# Patient Record
Sex: Female | Born: 1950 | Race: White | Hispanic: No | Marital: Married | Smoking: Never smoker
Health system: Southern US, Community
[De-identification: ages and names within clinical notes are randomized; demographics above are authoritative.]

## PROBLEM LIST (undated history)

## (undated) DIAGNOSIS — R011 Cardiac murmur, unspecified: Secondary | ICD-10-CM

## (undated) DIAGNOSIS — G8929 Other chronic pain: Secondary | ICD-10-CM

## (undated) DIAGNOSIS — K219 Gastro-esophageal reflux disease without esophagitis: Secondary | ICD-10-CM

## (undated) DIAGNOSIS — K635 Polyp of colon: Secondary | ICD-10-CM

## (undated) DIAGNOSIS — M1990 Unspecified osteoarthritis, unspecified site: Secondary | ICD-10-CM

## (undated) DIAGNOSIS — M549 Dorsalgia, unspecified: Secondary | ICD-10-CM

## (undated) HISTORY — DX: Polyp of colon: K63.5

## (undated) HISTORY — PX: DILATION AND CURETTAGE, DIAGNOSTIC / THERAPEUTIC: SUR384

## (undated) HISTORY — PX: COLONOSCOPY: SHX174

## (undated) HISTORY — PX: ABDOMINOPLASTY: SUR9

## (undated) HISTORY — PX: DILATION AND CURETTAGE OF UTERUS: SHX78

## (undated) HISTORY — PX: AUGMENTATION MAMMAPLASTY: SUR837

## (undated) HISTORY — PX: BREAST BIOPSY: SHX20

## (undated) HISTORY — PX: JOINT REPLACEMENT: SHX530

---

## 2005-02-06 ENCOUNTER — Ambulatory Visit: Payer: Self-pay | Admitting: Internal Medicine

## 2009-08-02 ENCOUNTER — Ambulatory Visit: Payer: Self-pay | Admitting: Internal Medicine

## 2009-09-21 ENCOUNTER — Ambulatory Visit: Payer: Self-pay | Admitting: Gastroenterology

## 2009-09-24 ENCOUNTER — Ambulatory Visit: Payer: Self-pay | Admitting: Gastroenterology

## 2011-07-21 ENCOUNTER — Ambulatory Visit: Payer: Self-pay | Admitting: Internal Medicine

## 2011-07-25 ENCOUNTER — Ambulatory Visit: Payer: Self-pay | Admitting: Internal Medicine

## 2011-08-18 ENCOUNTER — Ambulatory Visit: Payer: Self-pay | Admitting: Surgery

## 2011-08-18 IMAGING — US US  BREAST BX W/ LOC DEV 1ST LESION IMG BX SPEC US GUIDE*R*
1 series · 9 of 9 positions shown · non-contrast
Comparison: none

REASON FOR EXAM: right breast mass
COMMENTS:

PROCEDURE:     US  - US GUIDED BIOPSY BREAST RIGHT  - [DATE]  [DATE]
RESULT:
HISTORY: Right breast mass.

[Series 1: us breast bx w/ loc dev 1st lesion img bx spec us  · 9 of 9 slices shown]
[im 1/9]
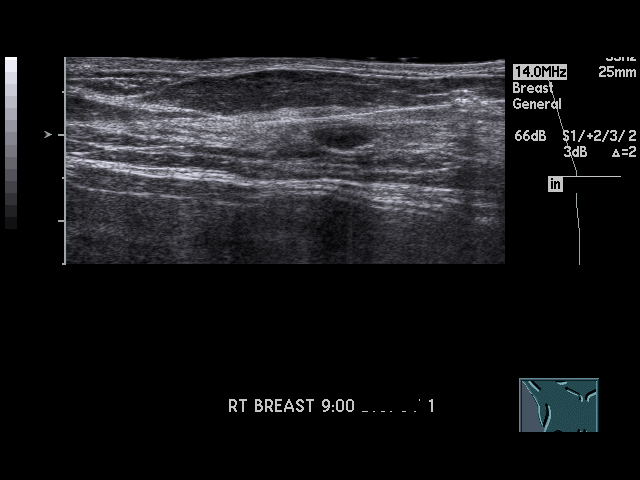
[im 2/9]
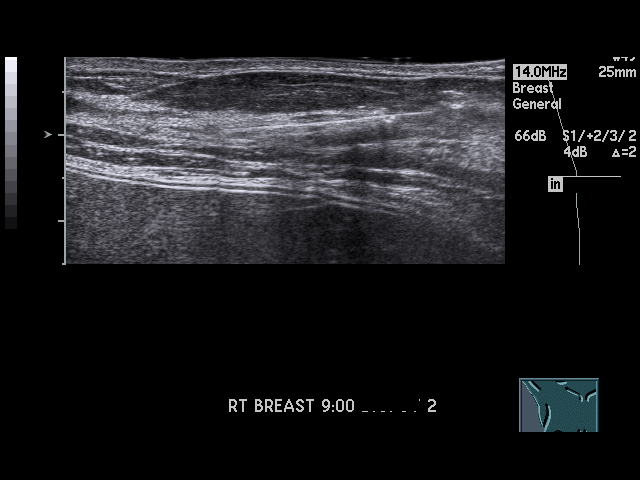
[im 3/9]
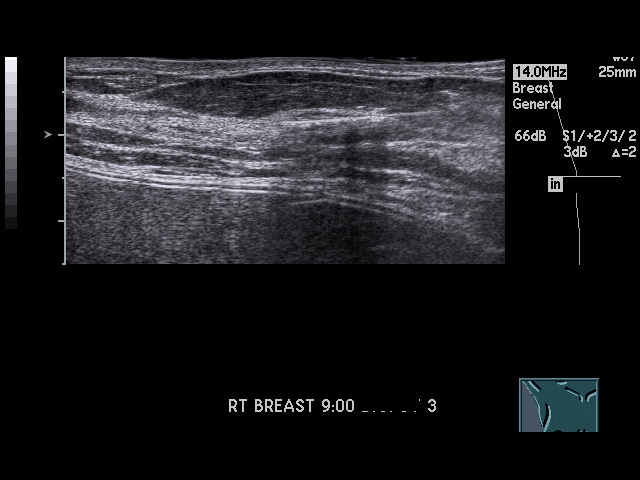
[im 4/9]
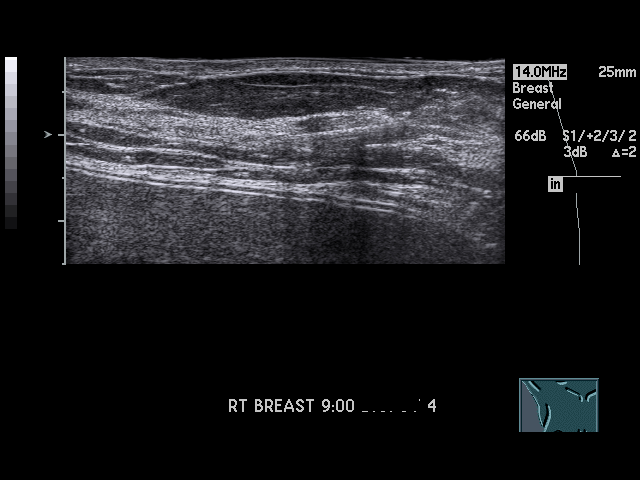
[im 5/9]
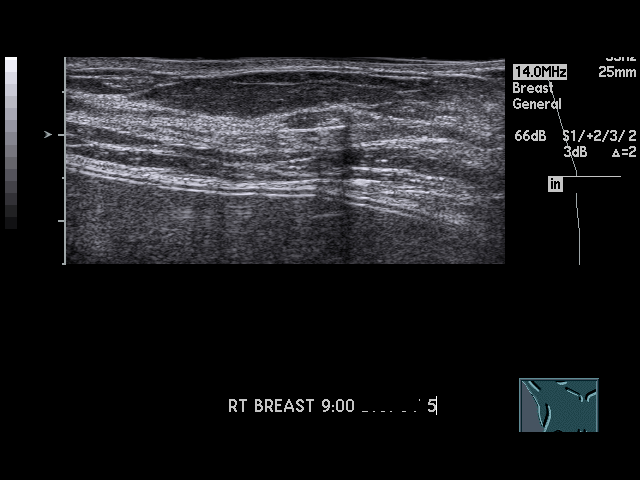
[im 6/9]
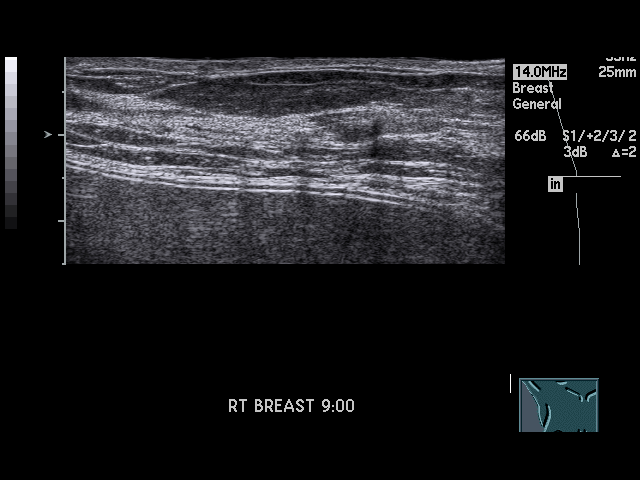
[im 7/9]
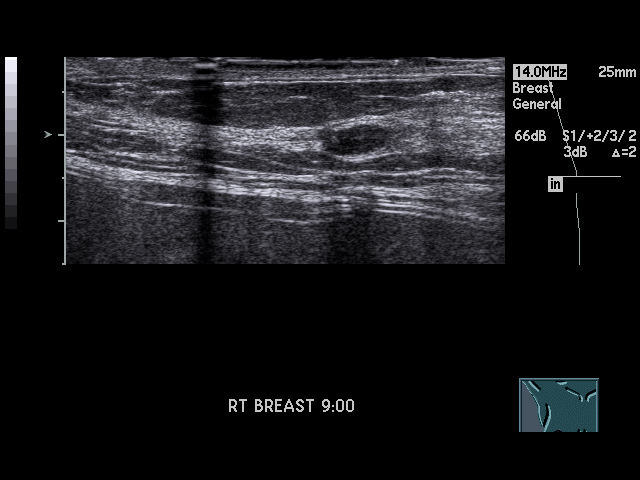
[im 8/9]
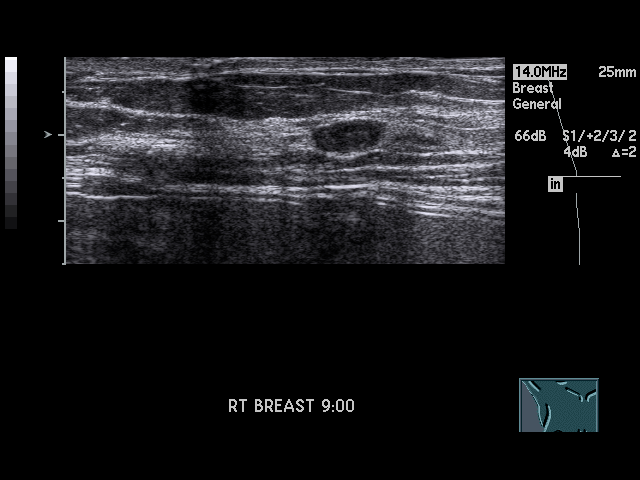
[im 9/9]
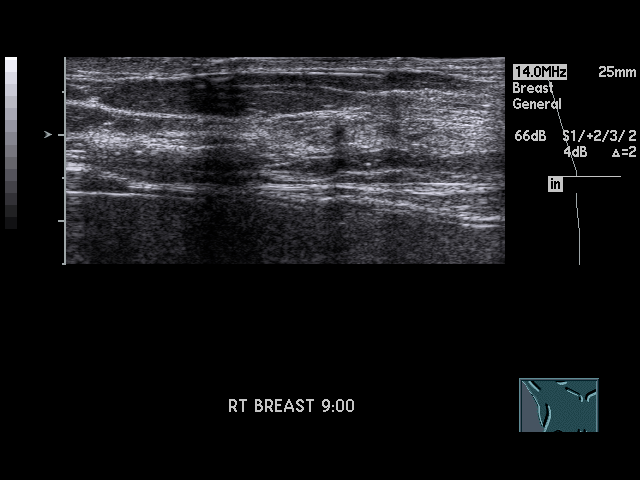

[9 of 9 positions shown; findings below may reference images not displayed]

PROCEDURE AND FINDINGS:  After discussing the risks and benefits of this
procedure, patient informed consent was obtained. The right breast was
sterilely prepped and draped. Following local anesthesia with 1% Lidocaine
and using ultrasound guidance, an 18 gauge Achieve Core Biopsy needle was
advanced into the lesion and multiple samples obtained. There were no
complications.
IMPRESSION: Successful right breast biopsy. If a definitive pathologic
diagnosis is not made, surgical removal of the nodular density is suggested.

## 2015-03-20 ENCOUNTER — Other Ambulatory Visit: Payer: Self-pay | Admitting: Nurse Practitioner

## 2015-03-20 DIAGNOSIS — K219 Gastro-esophageal reflux disease without esophagitis: Secondary | ICD-10-CM

## 2015-03-27 ENCOUNTER — Ambulatory Visit
Admission: RE | Admit: 2015-03-27 | Discharge: 2015-03-27 | Disposition: A | Discharge status: Home / Self Care | Payer: BC Managed Care – PPO | Source: Ambulatory Visit | Attending: Nurse Practitioner | Admitting: Nurse Practitioner

## 2015-03-27 DIAGNOSIS — K219 Gastro-esophageal reflux disease without esophagitis: Secondary | ICD-10-CM | POA: Insufficient documentation

## 2015-06-04 DIAGNOSIS — K219 Gastro-esophageal reflux disease without esophagitis: Secondary | ICD-10-CM | POA: Insufficient documentation

## 2015-06-04 DIAGNOSIS — K635 Polyp of colon: Secondary | ICD-10-CM | POA: Insufficient documentation

## 2015-07-20 ENCOUNTER — Encounter: Payer: Self-pay | Admitting: *Deleted

## 2015-07-23 ENCOUNTER — Ambulatory Visit: Payer: BC Managed Care – PPO | Admitting: Anesthesiology

## 2015-07-23 ENCOUNTER — Encounter
Admission: RE | Disposition: A | Discharge status: Home / Self Care | Payer: Self-pay | Source: Ambulatory Visit | Attending: Gastroenterology

## 2015-07-23 ENCOUNTER — Ambulatory Visit
Admission: RE | Admit: 2015-07-23 | Discharge: 2015-07-23 | Disposition: A | Discharge status: Home / Self Care | Payer: BC Managed Care – PPO | Source: Ambulatory Visit | Attending: Gastroenterology | Admitting: Gastroenterology

## 2015-07-23 DIAGNOSIS — D120 Benign neoplasm of cecum: Secondary | ICD-10-CM | POA: Insufficient documentation

## 2015-07-23 DIAGNOSIS — K640 First degree hemorrhoids: Secondary | ICD-10-CM | POA: Insufficient documentation

## 2015-07-23 DIAGNOSIS — K6389 Other specified diseases of intestine: Secondary | ICD-10-CM | POA: Diagnosis not present

## 2015-07-23 DIAGNOSIS — G8929 Other chronic pain: Secondary | ICD-10-CM | POA: Insufficient documentation

## 2015-07-23 DIAGNOSIS — Z1211 Encounter for screening for malignant neoplasm of colon: Secondary | ICD-10-CM | POA: Insufficient documentation

## 2015-07-23 DIAGNOSIS — Z79899 Other long term (current) drug therapy: Secondary | ICD-10-CM | POA: Insufficient documentation

## 2015-07-23 DIAGNOSIS — K219 Gastro-esophageal reflux disease without esophagitis: Secondary | ICD-10-CM | POA: Insufficient documentation

## 2015-07-23 DIAGNOSIS — M1990 Unspecified osteoarthritis, unspecified site: Secondary | ICD-10-CM | POA: Insufficient documentation

## 2015-07-23 DIAGNOSIS — K5730 Diverticulosis of large intestine without perforation or abscess without bleeding: Secondary | ICD-10-CM | POA: Insufficient documentation

## 2015-07-23 DIAGNOSIS — K621 Rectal polyp: Secondary | ICD-10-CM | POA: Diagnosis not present

## 2015-07-23 HISTORY — DX: Cardiac murmur, unspecified: R01.1

## 2015-07-23 HISTORY — DX: Unspecified osteoarthritis, unspecified site: M19.90

## 2015-07-23 HISTORY — DX: Other chronic pain: G89.29

## 2015-07-23 HISTORY — DX: Gastro-esophageal reflux disease without esophagitis: K21.9

## 2015-07-23 HISTORY — DX: Dorsalgia, unspecified: M54.9

## 2015-07-23 HISTORY — PX: COLONOSCOPY WITH PROPOFOL: SHX5780

## 2015-07-23 SURGERY — COLONOSCOPY WITH PROPOFOL
Anesthesia: General

## 2015-07-23 MED ORDER — PHENYLEPHRINE HCL 10 MG/ML IJ SOLN
INTRAMUSCULAR | Status: DC | PRN
  Administered 2015-07-23 (×2): 100 ug via INTRAVENOUS

## 2015-07-23 MED ORDER — PROPOFOL 10 MG/ML IV BOLUS
INTRAVENOUS | Status: DC | PRN
  Administered 2015-07-23: 30 mg via INTRAVENOUS

## 2015-07-23 MED ORDER — SODIUM CHLORIDE 0.9 % IV SOLN: INTRAVENOUS | Status: DC

## 2015-07-23 MED ORDER — SODIUM CHLORIDE 0.9 % IV SOLN
INTRAVENOUS | Status: DC
  Administered 2015-07-23: 1000 mL via INTRAVENOUS

## 2015-07-23 MED ORDER — FENTANYL CITRATE (PF) 100 MCG/2ML IJ SOLN
INTRAMUSCULAR | Status: DC | PRN
  Administered 2015-07-23: 2 ug via INTRAVENOUS

## 2015-07-23 MED ORDER — MIDAZOLAM HCL 2 MG/2ML IJ SOLN
INTRAMUSCULAR | Status: DC | PRN
  Administered 2015-07-23: 2 mg via INTRAVENOUS

## 2015-07-23 MED ORDER — PROPOFOL 500 MG/50ML IV EMUL
INTRAVENOUS | Status: DC | PRN
  Administered 2015-07-23: 120 ug/kg/min via INTRAVENOUS

## 2015-07-23 NOTE — Anesthesia Preprocedure Evaluation (Addendum)
Anesthesia Evaluation  Patient identified by MRN, date of birth, ID band Patient awake    Reviewed: Allergy & Precautions, NPO status , Patient's Chart, lab work & pertinent test results  Airway Mallampati: I       Dental  (+) Teeth Intact   Pulmonary neg pulmonary ROS,    Pulmonary exam normal        Cardiovascular negative cardio ROS   Rhythm:Regular     Neuro/Psych negative neurological ROS     GI/Hepatic Neg liver ROS, GERD  ,  Endo/Other  negative endocrine ROS  Renal/GU negative Renal ROS     Musculoskeletal   Abdominal Normal abdominal exam  (+)   Peds  Hematology negative hematology ROS (+)   Anesthesia Other Findings   Reproductive/Obstetrics                            Anesthesia Physical Anesthesia Plan  ASA: I  Anesthesia Plan: General   Post-op Pain Management:    Induction: Intravenous  Airway Management Planned: Natural Airway and Nasal Cannula  Additional Equipment:   Intra-op Plan:   Post-operative Plan:   Informed Consent: I have reviewed the patients History and Physical, chart, labs and discussed the procedure including the risks, benefits and alternatives for the proposed anesthesia with the patient or authorized representative who has indicated his/her understanding and acceptance.     Plan Discussed with: CRNA  Anesthesia Plan Comments:         Anesthesia Quick Evaluation

## 2015-07-23 NOTE — H&P (Signed)
Outpatient short stay form Pre-procedure 07/23/2015 8:06 AM Kathleen Sails MD  Primary Physician: Dr. Clayborn Bigness  Reason for visit:  Colonoscopy  History of present illness:  Personal history of adenomatous colon polyps. Patient is a 65 year old female presenting today for colonoscopy. She tolerated her prep well. She takes no aspirin or blood thinning products recently.    Current facility-administered medications:  .  0.9 %  sodium chloride infusion, , Intravenous, Continuous, Kathleen Sails, MD .  0.9 %  sodium chloride infusion, , Intravenous, Continuous, Kathleen Sails, MD  Prescriptions prior to admission  Medication Sig Dispense Refill Last Dose  . ascorbic Acid (VITAMIN C) 500 MG CPCR Take 500 mg by mouth daily.     . calcium-vitamin D (OSCAL WITH D) 500-200 MG-UNIT tablet Take 1 tablet by mouth 2 (two) times daily.     . cholecalciferol (D-VI-SOL) 400 UNIT/ML LIQD Take 1,000 Units by mouth daily.     . magnesium oxide (MAG-OX) 400 MG tablet Take 400 mg by mouth daily.     . Multiple Vitamin (MULTIVITAMIN) tablet Take 1 tablet by mouth daily.     Marland Kitchen omeprazole (PRILOSEC) 40 MG capsule Take 40 mg by mouth daily.     Marland Kitchen zinc gluconate 50 MG tablet Take 50 mg by mouth daily.        No Known Allergies   Past Medical History  Diagnosis Date  . Chronic back pain   . GERD (gastroesophageal reflux disease)   . Osteoarthritis   . Heart murmur     Review of systems:      Physical Exam    Heart and lungs: Regular rate and rhythm without rub or gallop, lungs are bilaterally clear.    HEENT: Normocephalic atraumatic eyes are anicteric    Other:     Pertinant exam for procedure: Soft nontender nondistended bowel sounds positive and normoactive.    Planned proceedures: Colonoscopy and indicated procedures. I have discussed the risks benefits and complications of procedures to include not limited to bleeding, infection, perforation and the risk of sedation and the  patient wishes to proceed.    Kathleen Sails, MD Gastroenterology 07/23/2015  8:06 AM

## 2015-07-23 NOTE — Anesthesia Postprocedure Evaluation (Signed)
Anesthesia Post Note  Patient: Kathleen Hart  Procedure(s) Performed: Procedure(s) (LRB): COLONOSCOPY WITH PROPOFOL (N/A)  Patient location during evaluation: PACU Anesthesia Type: General Level of consciousness: awake Pain management: pain level controlled Vital Signs Assessment: post-procedure vital signs reviewed and stable Respiratory status: spontaneous breathing Cardiovascular status: stable Anesthetic complications: no    Last Vitals:  Filed Vitals:   07/23/15 0753 07/23/15 0848  BP: 105/77 89/53  Pulse: 83 64  Temp: 36.4 C 36.1 C  Resp: 17 14    Last Pain: There were no vitals filed for this visit.               VAN STAVEREN,[name]

## 2015-07-23 NOTE — Op Note (Signed)
[facility] Gastroenterology Patient Name: Kathleen Hart Procedure Date: 07/23/2015 8:11 AM MRN: [id number] Account #: [id number] Date of Birth: February 26, 1951 Admit Type: Outpatient Age: 65 Room: [facility] ENDO ROOM 2 Gender: Female Note Status: Finalized Procedure:            Colonoscopy Indications:          Personal history of colonic polyps Providers:            Lollie Sails, MD Referring MD:         Lavera Guise, MD (Referring MD) Medicines:            Monitored Anesthesia Care Complications:        No immediate complications. Procedure:            Pre-Anesthesia Assessment:                       - ASA Grade Assessment: II - A patient with mild                        systemic disease.                       After obtaining informed consent, the colonoscope was                        passed under direct vision. Throughout the procedure,                        the patient's blood pressure, pulse, and oxygen                        saturations were monitored continuously. The                        Colonoscope was introduced through the anus and                        advanced to the the cecum, identified by appendiceal                        orifice and ileocecal valve. The colonoscopy was                        performed without difficulty. The patient tolerated the                        procedure well. The quality of the bowel preparation                        was good. Findings:      A 2 mm polyp was found in the cecum. The polyp was sessile. The polyp       was removed with a cold biopsy forceps. Resection and retrieval were       complete.      Five sessile polyps were found in the rectum. The polyps were 1 to 2 mm       in size. These polyps were removed with a cold biopsy forceps. Resection       and retrieval were complete.      Multiple small-mouthed diverticula were found in the sigmoid colon and  descending colon.      Non-bleeding internal  hemorrhoids were found during retroflexion. The       hemorrhoids were small and Grade I (internal hemorrhoids that do not       prolapse).      The digital rectal exam was normal. Impression:           - One 2 mm polyp in the cecum, removed with a cold                        biopsy forceps. Resected and retrieved.                       - Five 1 to 2 mm polyps in the rectum, removed with a                        cold biopsy forceps. Resected and retrieved.                       - Diverticulosis in the sigmoid colon and in the                        descending colon.                       - Non-bleeding internal hemorrhoids. Recommendation:       - Discharge patient to home. Procedure Code(s):    --- Professional ---                       [phone], Colonoscopy, flexible; with biopsy, single or                        multiple Diagnosis Code(s):    --- Professional ---                       D12.0, Benign neoplasm of cecum                       K62.1, Rectal polyp                       K64.0, First degree hemorrhoids                       Z86.010, Personal history of colonic polyps                       K57.30, Diverticulosis of large intestine without                        perforation or abscess without bleeding CPT copyright 2016 [employer]. All rights reserved. The codes documented in this report are preliminary and upon coder review may  be revised to meet current compliance requirements. Lollie Sails, MD 07/23/2015 8:46:29 AM This report has been signed electronically. Number of Addenda: 0 Note Initiated On: 07/23/2015 8:11 AM Scope Withdrawal Time: 0 hours 14 minutes 44 seconds  Total Procedure Duration: 0 hours 25 minutes 42 seconds       [facility]

## 2015-07-23 NOTE — Transfer of Care (Signed)
Immediate Anesthesia Transfer of Care Note  Patient: Kathleen Hart  Procedure(s) Performed: Procedure(s): COLONOSCOPY WITH PROPOFOL (N/A)  Patient Location: PACU  Anesthesia Type:General  Level of Consciousness: awake, alert  and oriented  Airway & Oxygen Therapy: Patient connected to nasal cannula oxygen  Post-op Assessment: Report given to RN  Post vital signs: Reviewed and stable  Last Vitals:  Filed Vitals:   07/23/15 0753  BP: 105/77  Pulse: 83  Temp: 36.4 C  Resp: 17    Complications: No apparent anesthesia complications

## 2015-07-24 LAB — SURGICAL PATHOLOGY

## 2015-07-25 ENCOUNTER — Encounter: Payer: Self-pay | Admitting: Gastroenterology

## 2015-10-18 DIAGNOSIS — G8929 Other chronic pain: Secondary | ICD-10-CM | POA: Insufficient documentation

## 2015-10-18 DIAGNOSIS — M1990 Unspecified osteoarthritis, unspecified site: Secondary | ICD-10-CM | POA: Insufficient documentation

## 2017-01-28 ENCOUNTER — Ambulatory Visit (INDEPENDENT_AMBULATORY_CARE_PROVIDER_SITE_OTHER): Payer: BC Managed Care – PPO | Admitting: Family

## 2017-01-28 ENCOUNTER — Ambulatory Visit: Payer: BC Managed Care – PPO | Admitting: Family

## 2017-01-28 ENCOUNTER — Encounter: Payer: Self-pay | Admitting: Family

## 2017-01-28 VITALS — BP 102/78 | HR 78 | Temp 97.9°F | Ht 69.0 in | Wt 228.0 lb

## 2017-01-28 DIAGNOSIS — Z7689 Persons encountering health services in other specified circumstances: Secondary | ICD-10-CM | POA: Insufficient documentation

## 2017-01-28 DIAGNOSIS — R350 Frequency of micturition: Secondary | ICD-10-CM | POA: Diagnosis not present

## 2017-01-28 DIAGNOSIS — E663 Overweight: Secondary | ICD-10-CM | POA: Insufficient documentation

## 2017-01-28 LAB — URINALYSIS, ROUTINE W REFLEX MICROSCOPIC
Bilirubin Urine: NEGATIVE
HGB URINE DIPSTICK: NEGATIVE
Ketones, ur: NEGATIVE
Leukocytes, UA: NEGATIVE
Nitrite: NEGATIVE
PH: 5.5 (ref 5.0–8.0)
RBC / HPF: NONE SEEN (ref 0–?)
TOTAL PROTEIN, URINE-UPE24: NEGATIVE
Urine Glucose: NEGATIVE
Urobilinogen, UA: 0.2 (ref 0.0–1.0)

## 2017-01-28 LAB — LIPID PANEL
CHOL/HDL RATIO: 3
Cholesterol: 169 mg/dL (ref 0–200)
HDL: 56.1 mg/dL (ref >39.00)
LDL CALC: 88 mg/dL (ref 0–99)
NONHDL: 112.4
Triglycerides: 122 mg/dL (ref 0.0–149.0)
VLDL: 24.4 mg/dL (ref 0.0–40.0)

## 2017-01-28 LAB — COMPREHENSIVE METABOLIC PANEL
ALK PHOS: 86 U/L (ref 39–117)
ALT: 14 U/L (ref 0–35)
AST: 14 U/L (ref 0–37)
Albumin: 4.4 g/dL (ref 3.5–5.2)
BUN: 15 mg/dL (ref 6–23)
CO2: 27 mEq/L (ref 19–32)
Calcium: 9.6 mg/dL (ref 8.4–10.5)
Chloride: 105 mEq/L (ref 96–112)
Creatinine, Ser: 0.76 mg/dL (ref 0.40–1.20)
GFR: 81.25 mL/min (ref >60.00)
Glucose, Bld: 111 mg/dL — ABNORMAL HIGH (ref 70–99)
POTASSIUM: 3.8 meq/L (ref 3.5–5.1)
SODIUM: 139 meq/L (ref 135–145)
TOTAL PROTEIN: 7.1 g/dL (ref 6.0–8.3)
Total Bilirubin: 1 mg/dL (ref 0.2–1.2)

## 2017-01-28 LAB — CBC WITH DIFFERENTIAL/PLATELET
BASOS PCT: 1 % (ref 0.0–3.0)
Basophils Absolute: 0.1 10*3/uL (ref 0.0–0.1)
EOS PCT: 5.2 % — AB (ref 0.0–5.0)
Eosinophils Absolute: 0.4 10*3/uL (ref 0.0–0.7)
HCT: 43.7 % (ref 36.0–46.0)
Hemoglobin: 14.8 g/dL (ref 12.0–15.0)
LYMPHS ABS: 2.2 10*3/uL (ref 0.7–4.0)
Lymphocytes Relative: 28.2 % (ref 12.0–46.0)
MCHC: 33.8 g/dL (ref 30.0–36.0)
MCV: 88.8 fl (ref 78.0–100.0)
MONO ABS: 0.7 10*3/uL (ref 0.1–1.0)
Monocytes Relative: 8.6 % (ref 3.0–12.0)
NEUTROS PCT: 57 % (ref 43.0–77.0)
Neutro Abs: 4.4 10*3/uL (ref 1.4–7.7)
PLATELETS: 199 10*3/uL (ref 150.0–400.0)
RBC: 4.92 Mil/uL (ref 3.87–5.11)
RDW: 13.6 % (ref 11.5–15.5)
WBC: 7.7 10*3/uL (ref 4.0–10.5)

## 2017-01-28 LAB — VITAMIN D 25 HYDROXY (VIT D DEFICIENCY, FRACTURES): VITD: 25.36 ng/mL — AB (ref 30.00–100.00)

## 2017-01-28 LAB — TSH: TSH: 3.91 u[IU]/mL (ref 0.35–4.50)

## 2017-01-28 LAB — HEMOGLOBIN A1C: HEMOGLOBIN A1C: 5.5 % (ref 4.6–6.5)

## 2017-01-28 NOTE — Progress Notes (Signed)
Subjective:    Patient ID: Kathleen Hart, female    DOB: January 12, 1951, 66 y.o.   MRN: [id number]  CC: Kathleen Hart is a 66 y.o. female who presents today to establish care.    HPI: Changing PCP from Dr Humphrey Rolls.   UTI's - 2 UTIs in the past year. One in March and the second in May. Didn't finish antibiotic from march. [facility]old she [facility].  Endorses urgency, urinary frequency for last 3 years, unchanged.   Frustrated by weight. 'highest every been'. Has been swimming this summer. Eats lots of vegetables   GERD- takes prilosec PRN. Well controlled. Improves when looses weight    mammogram utd, normal per patient this year.  HISTORY:  Past Medical History:  Diagnosis Date  . Chronic back pain   . Colon polyps   . GERD (gastroesophageal reflux disease)   . Heart murmur   . Osteoarthritis    Past Surgical History:  Procedure Laterality Date  . ABDOMINOPLASTY    . AUGMENTATION MAMMAPLASTY    . BREAST BIOPSY    . COLONOSCOPY    . COLONOSCOPY WITH PROPOFOL N/A 07/23/2015   Procedure: COLONOSCOPY WITH PROPOFOL;  Surgeon: Lollie Sails, MD;  Location: [facility] ENDOSCOPY;  Service: Endoscopy;  Laterality: N/A;  . DILATION AND CURETTAGE OF UTERUS    . DILATION AND CURETTAGE, DIAGNOSTIC / THERAPEUTIC     Family History  Problem Relation Age of Onset  . Arthritis Mother   . Kidney disease Mother   . Cancer Mother        bladder  . Cancer Father        prostate    Allergies: Patient has no known allergies. Current Outpatient Prescriptions on File Prior to Visit  Medication Sig Dispense Refill  . ascorbic Acid (VITAMIN C) 500 MG CPCR Take 500 mg by mouth daily.    . calcium-vitamin D (OSCAL WITH D) 500-200 MG-UNIT tablet Take 1 tablet by mouth 2 (two) times daily.    . magnesium oxide (MAG-OX) 400 MG tablet Take 400 mg by mouth daily.    . Multiple Vitamin (MULTIVITAMIN) tablet Take 1 tablet by mouth daily.    Marland Kitchen omeprazole (PRILOSEC) 40 MG  capsule Take 40 mg by mouth daily.     No current facility-administered medications on file prior to visit.     Social History  Substance Use Topics  . Smoking status: Never Smoker  . Smokeless tobacco: Never Used  . Alcohol use Yes    Review of Systems  Constitutional: Negative for chills and fever.  Respiratory: Negative for cough.   Cardiovascular: Negative for chest pain and palpitations.  Gastrointestinal: Negative for nausea and vomiting.  Genitourinary: Positive for frequency and urgency. Negative for dysuria and hematuria.      Objective:    BP 102/78   Pulse 78   Temp 97.9 F (36.6 C) (Oral)   Ht 5\' 9"  (1.753 m)   Wt 228 lb (103.4 kg)   SpO2 98%   BMI 33.67 kg/m  BP Readings from Last 3 Encounters:  01/28/17 102/78  07/23/15 97/68   Wt Readings from Last 3 Encounters:  01/28/17 228 lb (103.4 kg)  07/23/15 200 lb (90.7 kg)    Physical Exam  Constitutional: She appears well-developed and well-nourished.  Eyes: Conjunctivae are normal.  Neck: No thyroid mass and no thyromegaly present.  Cardiovascular: Normal rate, regular rhythm, normal heart sounds and normal pulses.   Pulmonary/Chest: Effort normal  and breath sounds normal. She has no wheezes. She has no rhonchi. She has no rales.  Abdominal: There is no CVA tenderness.  Lymphadenopathy:       Head (right side): No submental, no submandibular, no tonsillar, no preauricular, no posterior auricular and no occipital adenopathy present.       Head (left side): No submental, no submandibular, no tonsillar, no preauricular, no posterior auricular and no occipital adenopathy present.    She has no cervical adenopathy.  Neurological: She is alert.  Skin: Skin is warm and dry.  Psychiatric: She has a normal mood and affect. Her speech is normal and behavior is normal. Thought content normal.  Vitals reviewed.      Assessment & Plan:   Problem List Items Addressed This Visit      Other   Encounter to  establish care - Primary    CPE labs ordered; patient will return for CPE.       Relevant Orders   Comprehensive metabolic panel   CBC with Differential/Platelet   Hemoglobin A1c   Lipid panel   TSH   VITAMIN D 25 Hydroxy (Vit-D Deficiency, Fractures)   Urinary frequency    Symptoms most consistent with OAB.  Education on timed bladder voiding. Pending urine studies, will follow.       Relevant Orders   Urinalysis, Routine w reflex microscopic   Overweight    Education provided on low glycemic diet. Discussed the role of medications including wellbutrin, saxenda, and we will continue to discuss weight loss management at future visits.           I have discontinued Ms. Ayotte's cholecalciferol and zinc gluconate. I am also having her maintain her ascorbic Acid, calcium-vitamin D, magnesium oxide, multivitamin, and omeprazole.   No orders of the defined types were placed in this encounter.   Return precautions given.   Risks, benefits, and alternatives of the medications and treatment plan prescribed today were discussed, and patient expressed understanding.   Education regarding symptom management and diagnosis given to patient on AVS.  Continue to follow with Burnard Hawthorne, FNP for routine health maintenance.   Kathleen Hart and I agreed with plan.   Mable Paris, FNP

## 2017-01-28 NOTE — Assessment & Plan Note (Signed)
Education provided on low glycemic diet. Discussed the role of medications including wellbutrin, saxenda, and we will continue to discuss weight loss management at future visits.

## 2017-01-28 NOTE — Assessment & Plan Note (Signed)
CPE labs ordered; patient will return for CPE.

## 2017-01-28 NOTE — Progress Notes (Signed)
Pre visit review using our clinic review tool, if applicable. No additional management support is needed unless otherwise documented below in the visit note. 

## 2017-01-28 NOTE — Assessment & Plan Note (Addendum)
Symptoms most consistent with OAB.  Education on timed bladder voiding. Pending urine studies, will follow.

## 2017-01-28 NOTE — Patient Instructions (Addendum)
Pleasure meeting you!  Please return for complete physical  This is  Dr. Lupita Dawn  example of a  "Low GI"  Diet:  It will allow you to lose 4 to 8  lbs  per month if you follow it carefully.  Your goal with exercise is a minimum of 30 minutes of aerobic exercise 5 days per week (Walking does not count once it becomes easy!)    All of the foods can be found at grocery stores and in bulk at [employer].  The Atkins protein bars and shakes are available in more varieties at [employer], [employer] and [employer].     7 AM Breakfast:  Choose from the following:  Low carbohydrate Protein  Shakes (I recommend the  Premier Protein chocolate shakes,  EAS AdvantEdge "Carb Control" shakes  Or the Atkins shakes all are under 3 net carbs)     a scrambled egg/bacon/cheese burrito made with Mission's "carb balance" whole wheat tortilla  (about 10 net carbs )  Regulatory affairs officer (basically a quiche without the pastry crust) that is eaten cold and very convenient way to get your eggs.  8 carbs)  If you make your own protein shakes, avoid bananas and pineapple,  And use low carb greek yogurt or original /unsweetened almond or soy milk    Avoid cereal and bananas, oatmeal and cream of wheat and grits. They are loaded with carbohydrates!   10 AM: high protein snack:  Protein bar by Atkins (the snack size, under 200 cal, usually < 6 net carbs).    A stick of cheese:  Around 1 carb,  100 cal     Dannon Light n Fit [place] Yogurt  (80 cal, 8 carbs)  Other so called "protein bars" and Greek yogurts tend to be loaded with carbohydrates.  Remember, in food advertising, the word "energy" is synonymous for " carbohydrate."  Lunch:   A Sandwich using the bread choices listed, Can use any  Eggs,  lunchmeat, grilled meat or canned tuna), avocado, regular mayo/mustard  and cheese.  A Salad using blue cheese, ranch,  Goddess or vinagrette,  Avoid taco shells, croutons or "confetti" and no "candied nuts" but  regular nuts OK.   No pretzels, nabs  or chips.  Pickles and miniature sweet peppers are a good low carb alternative that provide a "crunch"  The bread is the only source of carbohydrate in a sandwich and  can be decreased by trying some of the attached alternatives to traditional loaf bread   Avoid "Low fat dressings, as well as Steele dressings They are loaded with sugar!   3 PM/ Mid day  Snack:  Consider  1 ounce of  almonds, walnuts, pistachios, pecans, peanuts,  Macadamia nuts or a nut medley.  Avoid "granola and granola bars "  Mixed nuts are ok in moderation as long as there are no raisins,  cranberries or dried fruit.   KIND bars are OK if you get the low glycemic index variety   Try the prosciutto/mozzarella cheese sticks by Fior[employer]  In deli /backery section   High protein      6 PM  Dinner:     Meat/fowl/fish with a green salad, and either broccoli, cauliflower, green beans, spinach, brussel sprouts or  Lima beans. DO NOT BREAD THE PROTEIN!!      There is a low carb pasta by Dreamfield's that is acceptable and tastes great: only 5 digestible carbs/serving.( All grocery stores  but BJs carry it ) Several ready made meals are available low carb:   Try Michel Angelo's chicken piccata or chicken or eggplant parm over low carb pasta.(Lowes and BJs)   Marjory Lies Sanchez's "Carnitas" (pulled pork, no sauce,  0 carbs) or his beef pot roast to make a dinner burrito (at [employer])  Pesto over low carb pasta (bj's sells a good quality pesto in the center refrigerated section of the deli   Try satueeing  Cheral Marker with mushroooms as a good side   [employer] makes a mashed cauliflower that tastes like mashed potatoes  Whole wheat pasta is still full of digestible carbs and  Not as low in glycemic index as Dreamfield's.   Brown rice is still rice,  So skip the rice and noodles if you eat [place] or [place] (or at least limit to 1/2 cup)  9 PM snack :   Breyer's "low carb" fudgsicle  or  ice cream bar (Carb Smart line), or  Weight Watcher's ice cream bar , or another "no sugar added" ice cream;  a serving of fresh berries/cherries with whipped cream   Cheese or DANNON'S LlGHT N FIT GREEK YOGURT  8 ounces of [place] unsweetened almond/cococunut milk    Treat yourself to a parfait made with whipped cream blueberiies, walnuts and vanilla greek yogurt  Avoid bananas, pineapple, grapes  and watermelon on a regular basis because they are high in sugar.  THINK OF THEM AS DESSERT  Remember that snack Substitutions should be less than 10 NET carbs per serving and meals < 20 carbs. Remember to subtract fiber grams to get the "net carbs."   Overactive Bladder, Adult Overactive bladder is a group of urinary symptoms. With overactive bladder, you may suddenly feel the need to pass urine (urinate) right away. After feeling this sudden urge, you might also leak urine if you cannot get to the bathroom fast enough (urinary incontinence). These symptoms might interfere with your daily work or social activities. Overactive bladder symptoms may also wake you up at night. Overactive bladder affects the nerve signals between your bladder and your brain. Your bladder may get the signal to empty before it is full. Very sensitive muscles can also make your bladder squeeze too soon. What are the causes? Many things can cause an overactive bladder. Possible causes include:  Urinary tract infection.  Infection of nearby tissues, such as the prostate.  Prostate enlargement.  Being pregnant with twins or more (multiples).  Surgery on the uterus or urethra.  Bladder stones, inflammation, or tumors.  Drinking too much caffeine or alcohol.  Certain medicines, especially those that you take to help your body get rid of extra fluid (diuretics) by increasing urine production.  Muscle or nerve weakness, especially from: ? A spinal cord injury. ? Stroke. ? Multiple sclerosis. ? Parkinson  disease.  Diabetes. This can cause a high urine volume that fills the bladder so quickly that the normal urge to urinate is triggered very strongly.  Constipation. A buildup of too much stool can put pressure on your bladder.  What increases the risk? You may be at greater risk for overactive bladder if you:  Are an older adult.  Smoke.  Are going through menopause.  Have prostate problems.  Have a neurological disease, such as stroke, dementia, Parkinson disease, or multiple sclerosis (MS).  Eat or drink things that irritate the bladder. These include alcohol, spicy food, and caffeine.  Are overweight or obese.  What are the signs  or symptoms? The signs and symptoms of an overactive bladder include:  Sudden, strong urges to urinate.  Leaking urine.  Urinating eight or more times per day.  Waking up to urinate two or more times per night.  How is this diagnosed? Your health care provider may suspect overactive bladder based on your symptoms. The health care provider will do a physical exam and take your medical history. Blood or urine tests may also be done. For example, you might need to have a bladder function test to check how well you can hold your urine. You might also need to see a health care provider who specializes in the urinary tract (urologist). How is this treated? Treatment for overactive bladder depends on the cause of your condition and whether it is mild or severe. Certain treatments can be done in your health care provider's office or clinic. You can also make lifestyle changes at home. Options include: Behavioral Treatments  Biofeedback. A specialist uses sensors to help you become aware of your body's signals.  Keeping a daily log of when you need to urinate and what happens after the urge. This may help you manage your condition.  Bladder training. This helps you learn to control the urge to urinate by following a schedule that directs you to urinate  at regular intervals (timed voiding). At first, you might have to wait a few minutes after feeling the urge. In time, you should be able to schedule bathroom visits an hour or more apart.  Kegel exercises. These are exercises to strengthen the pelvic floor muscles, which support the bladder. Toning these muscles can help you control urination, even if your bladder muscles are overactive. A specialist will teach you how to do these exercises correctly. They require daily practice.  Weight loss. If you are obese or overweight, losing weight might relieve your symptoms of overactive bladder. Talk to your health care provider about losing weight and whether there is a specific program or method that would work best for you.  Diet change. This might help if constipation is making your overactive bladder worse. Your health care provider or a dietitian can explain ways to change what you eat to ease constipation. You might also need to consume less alcohol and caffeine or drink other fluids at different times of the day.  Stopping smoking.  Wearing pads to absorb leakage while you wait for other treatments to take effect. Physical Treatments  Electrical stimulation. Electrodes send gentle pulses of electricity to strengthen the nerves or muscles that help to control the bladder. Sometimes, the electrodes are placed outside of the body. In other cases, they might be placed inside the body (implanted). This treatment can take several months to have an effect.  Supportive devices. Women may need a plastic device that fits into the vagina and supports the bladder (pessary). Medicines Several medicines can help treat overactive bladder and are usually used along with other treatments. Some are injected into the muscles involved in urination. Others come in pill form. Your health care provider may prescribe:  Antispasmodics. These medicines block the signals that the nerves send to the bladder. This keeps the  bladder from releasing urine at the wrong time.  Tricyclic antidepressants. These types of antidepressants also relax bladder muscles.  Surgery  You may have a device implanted to help manage the nerve signals that indicate when you need to urinate.  You may have surgery to implant electrodes for electrical stimulation.  Sometimes, very severe cases of  overactive bladder require surgery to change the shape of the bladder. Follow these instructions at home:  Take medicines only as directed by your health care provider.  Use any implants or a pessary as directed by your health care provider.  Make any diet or lifestyle changes that are recommended by your health care provider. These might include: ? Drinking less fluid or drinking at different times of the day. If you need to urinate often during the night, you may need to stop drinking fluids early in the evening. ? Cutting down on caffeine or alcohol. Both can make an overactive bladder worse. Caffeine is found in coffee, tea, and sodas. ? Doing Kegel exercises to strengthen muscles. ? Losing weight if you need to. ? Eating a healthy and balanced diet to prevent constipation.  Keep a journal or log to track how much and when you drink and also when you feel the need to urinate. This will help your health care provider to monitor your condition. Contact a health care provider if:  Your symptoms do not get better after treatment.  Your pain and discomfort are getting worse.  You have more frequent urges to urinate.  You have a fever. Get help right away if: You are not able to control your bladder at all. This information is not intended to replace advice given to you by your health care provider. Make sure you discuss any questions you have with your health care provider. Document Released: 02/15/2009 Document Revised: 09/27/2015 Document Reviewed: 09/14/2013 Elsevier Interactive Patient Education  [employer].

## 2017-01-29 ENCOUNTER — Telehealth: Payer: Self-pay | Admitting: *Deleted

## 2017-01-29 NOTE — Telephone Encounter (Signed)
Pt requested lab results  Pt contact [phone]

## 2017-01-29 NOTE — Telephone Encounter (Signed)
Patient was informed of results.  Patient understood and no questions, comments, or concerns at this time.  

## 2017-07-20 LAB — HM MAMMOGRAPHY

## 2018-02-24 ENCOUNTER — Other Ambulatory Visit: Payer: Self-pay

## 2018-02-24 ENCOUNTER — Encounter
Admission: RE | Admit: 2018-02-24 | Discharge: 2018-02-24 | Disposition: A | Discharge status: Home / Self Care | Payer: Medicare Other | Source: Ambulatory Visit | Attending: Surgery | Admitting: Surgery

## 2018-02-24 ENCOUNTER — Ambulatory Visit
Admission: RE | Admit: 2018-02-24 | Discharge: 2018-02-24 | Disposition: A | Discharge status: Home / Self Care | Payer: Medicare Other | Source: Ambulatory Visit | Attending: Surgery | Admitting: Surgery

## 2018-02-24 DIAGNOSIS — M1711 Unilateral primary osteoarthritis, right knee: Secondary | ICD-10-CM | POA: Insufficient documentation

## 2018-02-24 DIAGNOSIS — Z01818 Encounter for other preprocedural examination: Secondary | ICD-10-CM | POA: Insufficient documentation

## 2018-02-24 LAB — URINALYSIS, ROUTINE W REFLEX MICROSCOPIC
Glucose, UA: NEGATIVE mg/dL
KETONES UR: NEGATIVE mg/dL
Leukocytes, UA: NEGATIVE
Nitrite: NEGATIVE
Protein, ur: NEGATIVE mg/dL
Specific Gravity, Urine: >1.03 — ABNORMAL HIGH (ref 1.005–1.030)
pH: 5.5 (ref 5.0–8.0)

## 2018-02-24 LAB — TYPE AND SCREEN
ABO/RH(D): B POS
Antibody Screen: NEGATIVE

## 2018-02-24 LAB — CBC
HEMATOCRIT: 44.1 % (ref 36.0–46.0)
Hemoglobin: 14.1 g/dL (ref 12.0–15.0)
MCH: 28.7 pg (ref 26.0–34.0)
MCHC: 32 g/dL (ref 30.0–36.0)
MCV: 89.6 fL (ref 80.0–100.0)
NRBC: 0 % (ref 0.0–0.2)
PLATELETS: 210 10*3/uL (ref 150–400)
RBC: 4.92 MIL/uL (ref 3.87–5.11)
RDW: 12.7 % (ref 11.5–15.5)
WBC: 9 10*3/uL (ref 4.0–10.5)

## 2018-02-24 LAB — BASIC METABOLIC PANEL
Anion gap: 7 (ref 5–15)
BUN: 31 mg/dL — AB (ref 8–23)
CO2: 28 mmol/L (ref 22–32)
CREATININE: 0.85 mg/dL (ref 0.44–1.00)
Calcium: 9.1 mg/dL (ref 8.9–10.3)
Chloride: 107 mmol/L (ref 98–111)
Glucose, Bld: 105 mg/dL — ABNORMAL HIGH (ref 70–99)
Potassium: 3.7 mmol/L (ref 3.5–5.1)
SODIUM: 142 mmol/L (ref 135–145)

## 2018-02-24 LAB — URINALYSIS, MICROSCOPIC (REFLEX): Bacteria, UA: NONE SEEN

## 2018-02-24 LAB — SURGICAL PCR SCREEN
MRSA, PCR: NEGATIVE
Staphylococcus aureus: NEGATIVE

## 2018-02-24 NOTE — Patient Instructions (Signed)
YOUR PROCEDURE IS ON Tuesday March 09, 2018  Report to Day Surgery on the 2nd floor of the [facility]. To find out your arrival time, please call [phone] between 1PM - 3PM on: Monday March 08, 2018  REMEMBER: Instructions that are not followed completely may result in serious medical risk, up to and including death; or upon the discretion of your surgeon and anesthesiologist your surgery may need to be rescheduled.  Do not eat food after midnight the night before surgery.  No gum chewing, lozengers or hard candies.  You may however, drink CLEAR liquids up to 2 hours before you are scheduled to arrive for your surgery. Do not drink anything within 2 hours of the start of your surgery.  Clear liquids include: - water  - apple juice without pulp - CLEAR gatorade - black coffee or tea (Do NOT add milk or creamers to the coffee or tea) Do NOT drink anything that is not on this list.  Type 1 and Type 2 diabetics should only drink water.    No Alcohol for 24 hours before or after surgery.  No Smoking including e-cigarettes for 24 hours prior to surgery.  No chewable tobacco products for at least 6 hours prior to surgery.  No nicotine patches on the day of surgery.  On the morning of surgery brush your teeth with toothpaste and water, you may rinse your mouth with mouthwash if you wish. Do not swallow any toothpaste or mouthwash.  Notify your doctor if there is any change in your medical condition (cold, fever, infection).  Do not wear jewelry, make-up, hairpins, clips or nail polish.  Do not wear lotions, powders, or perfumes.   Do not shave 48 hours prior to surgery.   Contacts and dentures may not be worn into surgery.  Do not bring valuables to the hospital, including drivers license, insurance or credit cards.   is not responsible for any belongings or valuables.   TAKE THESE MEDICATIONS THE MORNING OF SURGERY: OMEPRAZOLE (take one the night before  and one on the morning of surgery - helps to prevent nausea after surgery.)  Use CHG Soap as directed on instruction sheet.  Stop Anti-inflammatories (NSAIDS) such as Advil, Aleve, Ibuprofen, Motrin, Naproxen, Naprosyn and Aspirin based products such as Excedrin, Goodys Powder, BC Powder FOR 7-10 DAYS BEFORE SURGERY  (May take Tylenol or Acetaminophen if needed.)  Stop ANY OVER THE COUNTER supplements until after surgery. (May continue Vitamin D, Vitamin B, and multivitamin.)  Wear comfortable clothing (specific to your surgery type) to the hospital.  Plan for stool softeners for home use.  If you are being admitted to the hospital overnight, leave your suitcase in the car. After surgery it may be brought to your room.  If you are being discharged the day of surgery, you will not be allowed to drive home. You will need a responsible adult to drive you home and stay with you that night.   If you are taking public transportation, you will need to have a responsible adult with you. Please confirm with your physician that it is acceptable to use public transportation.   Please call [phone] if you have any questions about these instructions.

## 2018-02-25 NOTE — Pre-Procedure Instructions (Signed)
UA FAXED TO DR POGGI 

## 2018-02-26 LAB — URINE CULTURE

## 2018-03-01 NOTE — Pre-Procedure Instructions (Signed)
UC FAXED TO DR POGGI 

## 2018-03-02 ENCOUNTER — Encounter
Admission: RE | Admit: 2018-03-02 | Discharge: 2018-03-02 | Disposition: A | Discharge status: Home / Self Care | Payer: Medicare Other | Source: Ambulatory Visit | Attending: Surgery | Admitting: Surgery

## 2018-03-02 DIAGNOSIS — Z01812 Encounter for preprocedural laboratory examination: Secondary | ICD-10-CM | POA: Diagnosis present

## 2018-03-02 NOTE — Pre-Procedure Instructions (Signed)
COMING TODAY FOR REPEAT UC

## 2018-03-03 ENCOUNTER — Ambulatory Visit: Payer: Medicare Other | Admitting: Family Medicine

## 2018-03-03 ENCOUNTER — Encounter: Payer: Self-pay | Admitting: Family Medicine

## 2018-03-03 VITALS — BP 118/80 | HR 67 | Temp 97.9°F | Wt 226.4 lb

## 2018-03-03 DIAGNOSIS — M150 Primary generalized (osteo)arthritis: Secondary | ICD-10-CM | POA: Diagnosis not present

## 2018-03-03 DIAGNOSIS — K219 Gastro-esophageal reflux disease without esophagitis: Secondary | ICD-10-CM | POA: Diagnosis not present

## 2018-03-03 DIAGNOSIS — Z23 Encounter for immunization: Secondary | ICD-10-CM

## 2018-03-03 DIAGNOSIS — R5382 Chronic fatigue, unspecified: Secondary | ICD-10-CM | POA: Diagnosis not present

## 2018-03-03 DIAGNOSIS — E669 Obesity, unspecified: Secondary | ICD-10-CM | POA: Insufficient documentation

## 2018-03-03 DIAGNOSIS — Z6833 Body mass index (BMI) 33.0-33.9, adult: Secondary | ICD-10-CM

## 2018-03-03 DIAGNOSIS — M159 Polyosteoarthritis, unspecified: Secondary | ICD-10-CM

## 2018-03-03 DIAGNOSIS — G8929 Other chronic pain: Secondary | ICD-10-CM

## 2018-03-03 LAB — URINE CULTURE: Culture: NO GROWTH

## 2018-03-03 MED ORDER — OMEPRAZOLE MAGNESIUM 20 MG PO TBEC: 20.0000 mg | DELAYED_RELEASE_TABLET | Freq: Every day | ORAL | 3 refills | Status: DC | PRN

## 2018-03-03 NOTE — Assessment & Plan Note (Signed)
Likely part of her vicious cycle of pain, lack of exercise, weight gain, inability to sleep well Will check TSH, but doubt this is hypothyroidism

## 2018-03-03 NOTE — Assessment & Plan Note (Signed)
Discussed importance of healthy weight management Suspect that her inability to lose weight lately has been related to her knee pain and inability to exercise We will continue to monitor Screening labs today Recent preop labs reviewed with patient

## 2018-03-03 NOTE — Patient Instructions (Signed)
Preventing Unhealthy Weight Gain, Adult Staying at a healthy weight is important. When fat builds up in your body, you may become overweight or obese. These conditions put you at greater risk for developing certain health problems, such as heart disease, diabetes, sleeping problems, joint problems, and some cancers. Unhealthy weight gain is often the result of making unhealthy choices in what you eat. It is also a result of not getting enough exercise. You can make changes to your lifestyle to prevent obesity and stay as healthy as possible. What nutrition changes can be made? To maintain a healthy weight and prevent obesity:  Eat only as much as your body needs. To do this: ? Pay attention to signs that you are hungry or full. Stop eating as soon as you feel full. ? If you feel hungry, try drinking water first. Drink enough water so your urine is clear or pale yellow. ? Eat smaller portions. ? Look at serving sizes on food labels. Most foods contain more than one serving per container. ? Eat the recommended amount of calories for your gender and activity level. While most active people should eat around 2,000 calories per day, if you are trying to lose weight or are not very active, you main need to eat less calories. Talk to your health care provider or dietitian about how many calories you should eat each day.  Choose healthy foods, such as: ? Fruits and vegetables. Try to fill at least half of your plate at each meal with fruits and vegetables. ? Whole grains, such as whole wheat bread, brown rice, and quinoa. ? Lean meats, such as chicken or fish. ? Other healthy proteins, such as beans, eggs, or tofu. ? Healthy fats, such as nuts, seeds, fatty fish, and olive oil. ? Low-fat or fat-free dairy.  Check food labels and avoid food and drinks that: ? Are high in calories. ? Have added sugar. ? Are high in sodium. ? Have saturated fats or trans fats.  Limit how much you eat of the following  foods: ? Prepackaged meals. ? Fast food. ? Fried foods. ? Processed meat, such as bacon, sausage, and deli meats. ? Fatty cuts of red meat and poultry with skin.  Cook foods in healthier ways, such as by baking, broiling, or grilling.  When grocery shopping, try to shop around the outside of the store. This helps you buy mostly fresh foods and avoid canned and prepackaged foods.  What lifestyle changes can be made?  Exercise at least 30 minutes 5 or more days each week. Exercising includes brisk walking, yard work, biking, running, swimming, and team sports like basketball and soccer. Ask your health care provider which exercises are safe for you.  Do not use any products that contain nicotine or tobacco, such as cigarettes and e-cigarettes. If you need help quitting, ask your health care provider.  Limit alcohol intake to no more than 1 drink a day for nonpregnant women and 2 drinks a day for men. One drink equals 12 oz of beer, 5 oz of wine, or 1 oz of hard liquor.  Try to get 7-9 hours of sleep each night. What other changes can be made?  Keep a food and activity journal to keep track of: ? What you ate and how many calories you had. Remember to count sauces, dressings, and side dishes. ? Whether you were active, and what exercises you did. ? Your calorie, weight, and activity goals.  Check your weight regularly. Track any changes.   If you notice you have gained weight, make changes to your diet or activity routine.  Avoid taking weight-loss medicines or supplements. Talk to your health care provider before starting any new medicine or supplement.  Talk to your health care provider before trying any new diet or exercise plan. Why are these changes important? Eating healthy, staying active, and having healthy habits not only help prevent obesity, they also:  Help you to manage stress and emotions.  Help you to connect with friends and family.  Improve your  self-esteem.  Improve your sleep.  Prevent long-term health problems.  What can happen if changes are not made? Being obese or overweight can cause you to develop joint or bone problems, which can make it hard for you to stay active or do activities you enjoy. Being obese or overweight also puts stress on your heart and lungs and can lead to health problems like diabetes, heart disease, and some cancers. Where to find more information: Talk with your health care provider or a dietitian about healthy eating and healthy lifestyle choices. You may also find other information through these resources:  U.[place]. Department of Agriculture MyPlate: www.choosemyplate.gov  American Heart Association: www.heart.org  Centers for Disease Control and Prevention: www.cdc.gov  Summary  Staying at a healthy weight is important. It helps prevent certain diseases and health problems, such as heart disease, diabetes, joint problems, sleep disorders, and some cancers.  Being obese or overweight can cause you to develop joint or bone problems, which can make it hard for you to stay active or do activities you enjoy.  You can prevent unhealthy weight gain by eating a healthy diet, exercising regularly, not smoking, limiting alcohol, and getting enough sleep.  Talk with your health care provider or a dietitian for guidance about healthy eating and healthy lifestyle choices. This information is not intended to replace advice given to you by your health care provider. Make sure you discuss any questions you have with your health care provider. Document Released: 04/22/2016 Document Revised: 05/28/2016 Document Reviewed: 05/28/2016 Elsevier Interactive Patient Education  2018 [employer]  

## 2018-03-03 NOTE — Progress Notes (Signed)
Patient: Kathleen Hart, Female    DOB: March 17, 1951, 67 y.o.   MRN: [id number] Visit Date: 03/03/2018  Today's Provider: Lavon Paganini, MD   No chief complaint on file.  Subjective:   New Patient:  Kathleen Hart is a 67 year old female who presents today to [facility] as a new patient.  She was previously seen at [facility].  She reports feeling fairly well due to knee surgery coming up next week. She is exercising none. She states she is sleeping poorly and wakes up 3-4 times per night. She is fatigued all the time.    She has osteoarthritis and chronic pain of both knees and her low back.  She occasionally gets pain in her neck as well.  She takes ibuprofen as needed.  She is never been on chronic narcotics.  She is unable to exercise due to pain.  She has upcoming total knee replacement next week with [facility].  Patient is frustrated by her lack of weight loss.  She has been working with [employer] and been on the keto diet this year and has only been able to maintain weight and not lose weight.  She knows this is due to her lack of exercise but she is limited due to her pain as above.  Patient also has GERD.  She takes Prilosec as needed, which is about every other day.  She watches her diet as she knows certain foods make this worse.  Patient is unsure when her last Pap smear was.  She gets mammograms annually at [facility] and believes that her last one was done in March 2019. -----------------------------------------------------------------   Review of Systems  Constitutional: Positive for activity change, fatigue and unexpected weight change.  HENT: Positive for congestion and postnasal drip.   Eyes: Negative.   Respiratory: Negative.   Cardiovascular: Negative.   Gastrointestinal: Positive for abdominal distention and constipation.  Endocrine: Positive for cold intolerance and polyuria.  Genitourinary: Positive for enuresis,  frequency and urgency.  Musculoskeletal: Positive for back pain, joint swelling, neck pain and neck stiffness.  Skin: Negative.   Allergic/Immunologic: Negative.   Neurological: Positive for headaches.  Hematological: Negative.   Psychiatric/Behavioral: Positive for sleep disturbance.    Social History      She  reports that she has never smoked. She has never used smokeless tobacco. She reports that she drinks alcohol. She reports that she does not use drugs.       Social History   Socioeconomic History  . Marital status: Married    Spouse name: Not on file  . Number of children: 2  . Years of education: Not on file  . Highest education level: Not on file  Occupational History  . 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 Financial resource strain: Not on file  . Food insecurity:    Worry: Not on file    Inability: Not on file  . Transportation needs:    Medical: Not on file    Non-medical: Not on file  Tobacco Use  . Smoking status: Never Smoker  . Smokeless tobacco: Never Used  Substance and Sexual Activity  . Alcohol use: Yes    Comment: occasional, social  . Drug use: No  . Sexual activity: Not Currently  Lifestyle  . Physical activity:    Days per week: Not on file    Minutes per session: Not on file  . Stress: Not on  file  Relationships  . Social connections:    Talks on phone: Not on file    Gets together: Not on file    Attends religious service: Not on file    Active member of club or organization: Not on file    Attends meetings of clubs or organizations: Not on file    Relationship status: Not on file  Other Topics Concern  . Not on file  Social History Narrative  . Not on file    Past Medical History:  Diagnosis Date  . Chronic back pain   . Colon polyps   . GERD (gastroesophageal reflux disease)   . Heart murmur   . Osteoarthritis      Patient Active Problem List   Diagnosis Date Noted  . Encounter to  establish care 01/28/2017  . Urinary frequency 01/28/2017  . Osteoarthritis 10/18/2015  . Chronic pain 10/18/2015  . Gastroesophageal reflux disease 06/04/2015  . Colon polyps 06/04/2015    Past Surgical History:  Procedure Laterality Date  . ABDOMINOPLASTY    . AUGMENTATION MAMMAPLASTY    . BREAST BIOPSY    . COLONOSCOPY    . COLONOSCOPY WITH PROPOFOL N/A 07/23/2015   Procedure: COLONOSCOPY WITH PROPOFOL;  Surgeon: Lollie Sails, MD;  Location: [facility] ENDOSCOPY;  Service: Endoscopy;  Laterality: N/A;  . DILATION AND CURETTAGE OF UTERUS    . DILATION AND CURETTAGE, DIAGNOSTIC / THERAPEUTIC      Family History        Family Status  Relation Name Status  . Mother  (Not Specified)  . Father  (Not Specified)  . Sister  (Not Specified)  . Mat Uncle  (Not Specified)        Her family history includes Arthritis in her mother and sister; Bladder Cancer in her maternal uncle; COPD in her mother; Cancer in her mother and sister; Cancer (age of onset: 57) in her father; Kidney disease in her mother.      Allergies  Allergen Reactions  . Sulfa Antibiotics Nausea Only     Current Outpatient Medications:  .  ibuprofen (ADVIL,MOTRIN) 200 MG tablet, Take 400 mg by mouth every 6 (six) hours as needed for headache or moderate pain., Disp: , Rfl:  .  omeprazole (PRILOSEC OTC) 20 MG tablet, Take 1 tablet (20 mg total) by mouth daily as needed (for acid reflux)., Disp: 90 tablet, Rfl: 3   Patient Care Team: Isla Crews, MD as PCP - General (Family Medicine)      Objective:   Vitals: BP 118/80 (BP Location: Left Arm, Patient Position: Sitting, Cuff Size: Normal)   Pulse 67   Temp 97.9 F (36.6 C) (Oral)   Wt 226 lb 6.4 oz (102.7 kg)   SpO2 96%   BMI 33.43 kg/m    Vitals:   03/03/18 1022  BP: 118/80  Pulse: 67  Temp: 97.9 F (36.6 C)  TempSrc: Oral  SpO2: 96%  Weight: 226 lb 6.4 oz (102.7 kg)     Physical Exam  Constitutional: She is oriented to person, place,  and time. She appears well-developed and well-nourished. No distress.  HENT:  Head: Normocephalic and atraumatic.  Right Ear: External ear normal.  Left Ear: External ear normal.  Nose: Nose normal.  Mouth/Throat: Oropharynx is clear and moist.  Eyes: Pupils are equal, round, and reactive to light. Conjunctivae and EOM are normal. No scleral icterus.  Neck: Neck supple. No thyromegaly present.  Cardiovascular: Normal rate, regular rhythm, normal heart sounds and  intact distal pulses.  No murmur heard. Pulmonary/Chest: Effort normal and breath sounds normal. No respiratory distress. She has no wheezes. She has no rales.  Abdominal: Soft. Bowel sounds are normal. She exhibits no distension. There is no tenderness. There is no rebound and no guarding.  Musculoskeletal: She exhibits no edema or deformity.  Lymphadenopathy:    She has no cervical adenopathy.  Neurological: She is alert and oriented to person, place, and time.  Skin: Skin is warm and dry. Capillary refill takes less than 2 seconds. No rash noted.  Psychiatric: She has a normal mood and affect. Her behavior is normal.  Vitals reviewed.    Depression Screen PHQ 2/9 Scores 03/03/2018 01/28/2017  PHQ - 2 Score 4 0  PHQ- 9 Score 15 -    Reviewed last PCP notes, HCM, and labs Will request mammogram records Patient declines flu vaccine  Assessment & Plan:     Establish care  Exercise Activities and Dietary recommendations Goals   None     Immunization History  Administered Date(s) Administered  . Pneumococcal Conjugate-13 03/03/2018  . Tdap 05/02/2015    Health Maintenance  Topic Date Due  . PAP SMEAR  05/10/1973  . MAMMOGRAM  05/10/2002  . DEXA SCAN  05/10/2017  . INFLUENZA VACCINE  01/02/2019 (Originally 12/03/2017)  . PNA vac Low Risk Adult (2 of 2 - PPSV23) 03/04/2019  . COLONOSCOPY  07/22/2020  . TETANUS/TDAP  05/01/2025  . Hepatitis C Screening  Completed  . HIV Screening  Completed     Discussed  health benefits of physical activity, and encouraged her to engage in regular exercise appropriate for her age and condition.    --------------------------------------------------------------------   Problem List Items Addressed This Visit      Digestive   Gastroesophageal reflux disease    Well-controlled and currently asymptomatic Continue omeprazole as needed Discussed long-term effects of PPIs when use chronically      Relevant Medications   omeprazole (PRILOSEC OTC) 20 MG tablet     Musculoskeletal and Integument   Osteoarthritis    Chronic Followed by Ortho Undergoing TKR next week Continue ibuprofen prn Discussed side effects of long term NSAIDs        Other   Chronic pain    Chronic and intermittent pain of low back and bilateral knees Related to osteoarthritis and DJD Followed by orthopedics Undergoing total knee replacement next week Continue ibuprofen as needed As above, discussed side effects of long-term NSAID use and discussed importance of using sparingly      Class 1 obesity without serious comorbidity with body mass index (BMI) of 33.0 to 33.9 in adult    Discussed importance of healthy weight management Suspect that her inability to lose weight lately has been related to her knee pain and inability to exercise We will continue to monitor Screening labs today Recent preop labs reviewed with patient      Relevant Orders   Lipid panel   TSH   Hepatic function panel   Chronic fatigue - Primary    Likely part of her vicious cycle of pain, lack of exercise, weight gain, inability to sleep well Will check TSH, but doubt this is hypothyroidism      Relevant Orders   TSH       Return in about 8 months (around 11/02/2018) for Welcome to Medicare (before Xmas).   The entirety of the information documented in the History of Present Illness, Review of Systems and Physical Exam were personally obtained  by me. Portions of this information were  initially documented by Tiburcio Pea and Hurman Horn, CMA and reviewed by me for thoroughness and accuracy.    Nikitia Crews, MD, MPH [facility] 03/03/2018 11:02 AM

## 2018-03-03 NOTE — Assessment & Plan Note (Signed)
Chronic and intermittent pain of low back and bilateral knees Related to osteoarthritis and DJD Followed by orthopedics Undergoing total knee replacement next week Continue ibuprofen as needed As above, discussed side effects of long-term NSAID use and discussed importance of using sparingly

## 2018-03-03 NOTE — Assessment & Plan Note (Signed)
Chronic Followed by Ortho Undergoing TKR next week Continue ibuprofen prn Discussed side effects of long term NSAIDs

## 2018-03-03 NOTE — Assessment & Plan Note (Signed)
Well-controlled and currently asymptomatic Continue omeprazole as needed Discussed long-term effects of PPIs when use chronically

## 2018-03-04 LAB — LIPID PANEL
CHOLESTEROL TOTAL: 173 mg/dL (ref 100–199)
Chol/HDL Ratio: 3.3 ratio (ref 0.0–4.4)
HDL: 53 mg/dL (ref >39)
LDL Calculated: 102 mg/dL — ABNORMAL HIGH (ref 0–99)
Triglycerides: 89 mg/dL (ref 0–149)
VLDL CHOLESTEROL CAL: 18 mg/dL (ref 5–40)

## 2018-03-04 LAB — HEPATIC FUNCTION PANEL
ALK PHOS: 97 IU/L (ref 39–117)
ALT: 18 IU/L (ref 0–32)
AST: 18 IU/L (ref 0–40)
Albumin: 4.2 g/dL (ref 3.6–4.8)
BILIRUBIN, DIRECT: 0.24 mg/dL (ref 0.00–0.40)
Bilirubin Total: 1 mg/dL (ref 0.0–1.2)
Total Protein: 6.5 g/dL (ref 6.0–8.5)

## 2018-03-04 LAB — TSH: TSH: 3.15 u[IU]/mL (ref 0.450–4.500)

## 2018-03-05 ENCOUNTER — Encounter: Payer: Self-pay | Admitting: Family Medicine

## 2018-03-08 MED ORDER — CEFAZOLIN SODIUM-DEXTROSE 2-4 GM/100ML-% IV SOLN
2.0000 g | Freq: Once | INTRAVENOUS | Status: AC
  Administered 2018-03-09: 2 g via INTRAVENOUS

## 2018-03-09 ENCOUNTER — Encounter
Admission: AD | Disposition: A | Discharge status: Home / Self Care | Payer: Self-pay | Source: Ambulatory Visit | Attending: Surgery

## 2018-03-09 ENCOUNTER — Ambulatory Visit: Payer: Medicare Other | Admitting: Anesthesiology

## 2018-03-09 ENCOUNTER — Inpatient Hospital Stay: Payer: Medicare Other

## 2018-03-09 ENCOUNTER — Other Ambulatory Visit: Payer: Self-pay

## 2018-03-09 ENCOUNTER — Observation Stay
Admission: AD | Admit: 2018-03-09 | Discharge: 2018-03-11 | Disposition: A | Discharge status: Home / Self Care | Payer: Medicare Other | Source: Ambulatory Visit | Attending: Surgery | Admitting: Surgery

## 2018-03-09 ENCOUNTER — Encounter: Payer: Self-pay | Admitting: *Deleted

## 2018-03-09 DIAGNOSIS — K219 Gastro-esophageal reflux disease without esophagitis: Secondary | ICD-10-CM | POA: Insufficient documentation

## 2018-03-09 DIAGNOSIS — Z79899 Other long term (current) drug therapy: Secondary | ICD-10-CM | POA: Diagnosis not present

## 2018-03-09 DIAGNOSIS — E669 Obesity, unspecified: Secondary | ICD-10-CM | POA: Diagnosis not present

## 2018-03-09 DIAGNOSIS — Z7951 Long term (current) use of inhaled steroids: Secondary | ICD-10-CM | POA: Diagnosis not present

## 2018-03-09 DIAGNOSIS — Z6833 Body mass index (BMI) 33.0-33.9, adult: Secondary | ICD-10-CM | POA: Diagnosis not present

## 2018-03-09 DIAGNOSIS — Z96659 Presence of unspecified artificial knee joint: Secondary | ICD-10-CM

## 2018-03-09 DIAGNOSIS — M1711 Unilateral primary osteoarthritis, right knee: Principal | ICD-10-CM | POA: Insufficient documentation

## 2018-03-09 DIAGNOSIS — Z96651 Presence of right artificial knee joint: Secondary | ICD-10-CM

## 2018-03-09 HISTORY — PX: TOTAL KNEE ARTHROPLASTY: SHX125

## 2018-03-09 LAB — GLUCOSE, CAPILLARY: Glucose-Capillary: 146 mg/dL — ABNORMAL HIGH (ref 70–99)

## 2018-03-09 LAB — ABO/RH: ABO/RH(D): B POS

## 2018-03-09 SURGERY — ARTHROPLASTY, KNEE, TOTAL
Anesthesia: Spinal | Site: Knee | Laterality: Right

## 2018-03-09 MED ORDER — KETOROLAC TROMETHAMINE 30 MG/ML IJ SOLN
INTRAMUSCULAR | Status: AC
  Administered 2018-03-09: 30 mg via INTRAVENOUS
  Filled 2018-03-09: qty 1

## 2018-03-09 MED ORDER — FENTANYL CITRATE (PF) 100 MCG/2ML IJ SOLN: 25.0000 ug | INTRAMUSCULAR | Status: DC | PRN

## 2018-03-09 MED ORDER — HYDROMORPHONE HCL 1 MG/ML IJ SOLN
0.5000 mg | INTRAMUSCULAR | Status: DC | PRN
  Administered 2018-03-09: 1 mg via INTRAVENOUS
  Filled 2018-03-09: qty 1

## 2018-03-09 MED ORDER — METOCLOPRAMIDE HCL 10 MG PO TABS: 5.0000 mg | ORAL_TABLET | Freq: Three times a day (TID) | ORAL | Status: DC | PRN

## 2018-03-09 MED ORDER — KETOROLAC TROMETHAMINE 15 MG/ML IJ SOLN
15.0000 mg | Freq: Four times a day (QID) | INTRAMUSCULAR | Status: AC
  Administered 2018-03-09 – 2018-03-10 (×4): 15 mg via INTRAVENOUS
  Filled 2018-03-09 (×4): qty 1

## 2018-03-09 MED ORDER — DIPHENHYDRAMINE HCL 12.5 MG/5ML PO ELIX: 12.5000 mg | ORAL_SOLUTION | ORAL | Status: DC | PRN

## 2018-03-09 MED ORDER — FENTANYL CITRATE (PF) 100 MCG/2ML IJ SOLN
INTRAMUSCULAR | Status: AC
  Filled 2018-03-09: qty 2

## 2018-03-09 MED ORDER — SODIUM CHLORIDE 0.9 % IV SOLN
INTRAVENOUS | Status: DC | PRN
  Administered 2018-03-09: 60 mL

## 2018-03-09 MED ORDER — DOCUSATE SODIUM 100 MG PO CAPS
100.0000 mg | ORAL_CAPSULE | Freq: Two times a day (BID) | ORAL | Status: DC
  Administered 2018-03-09 – 2018-03-11 (×5): 100 mg via ORAL
  Filled 2018-03-09 (×5): qty 1

## 2018-03-09 MED ORDER — GLYCOPYRROLATE 0.2 MG/ML IJ SOLN
INTRAMUSCULAR | Status: AC
  Filled 2018-03-09: qty 1

## 2018-03-09 MED ORDER — OXYCODONE HCL 5 MG PO TABS
5.0000 mg | ORAL_TABLET | ORAL | Status: DC | PRN
  Administered 2018-03-10: 5 mg via ORAL
  Filled 2018-03-09: qty 1

## 2018-03-09 MED ORDER — SODIUM CHLORIDE 0.9 % IV SOLN
INTRAVENOUS | Status: DC
  Administered 2018-03-09 – 2018-03-10 (×2): via INTRAVENOUS

## 2018-03-09 MED ORDER — BUPIVACAINE LIPOSOME 1.3 % IJ SUSP
INTRAMUSCULAR | Status: AC
  Filled 2018-03-09: qty 20

## 2018-03-09 MED ORDER — LIDOCAINE HCL (PF) 2 % IJ SOLN
INTRAMUSCULAR | Status: AC
  Filled 2018-03-09: qty 10

## 2018-03-09 MED ORDER — PROPOFOL 500 MG/50ML IV EMUL
INTRAVENOUS | Status: AC
  Filled 2018-03-09: qty 50

## 2018-03-09 MED ORDER — METOCLOPRAMIDE HCL 5 MG/ML IJ SOLN: 5.0000 mg | Freq: Three times a day (TID) | INTRAMUSCULAR | Status: DC | PRN

## 2018-03-09 MED ORDER — MIDAZOLAM HCL 2 MG/2ML IJ SOLN
INTRAMUSCULAR | Status: AC
  Filled 2018-03-09: qty 2

## 2018-03-09 MED ORDER — ACETAMINOPHEN 325 MG PO TABS: 325.0000 mg | ORAL_TABLET | Freq: Four times a day (QID) | ORAL | Status: DC | PRN

## 2018-03-09 MED ORDER — ONDANSETRON HCL 4 MG/2ML IJ SOLN: 4.0000 mg | Freq: Four times a day (QID) | INTRAMUSCULAR | Status: DC | PRN

## 2018-03-09 MED ORDER — TRAMADOL HCL 50 MG PO TABS
50.0000 mg | ORAL_TABLET | Freq: Four times a day (QID) | ORAL | Status: DC | PRN
  Administered 2018-03-09 – 2018-03-11 (×6): 50 mg via ORAL
  Filled 2018-03-09 (×6): qty 1

## 2018-03-09 MED ORDER — ACETAMINOPHEN 500 MG PO TABS
1000.0000 mg | ORAL_TABLET | Freq: Four times a day (QID) | ORAL | Status: AC
  Administered 2018-03-09 – 2018-03-10 (×4): 1000 mg via ORAL
  Filled 2018-03-09 (×4): qty 2

## 2018-03-09 MED ORDER — LACTATED RINGERS IV SOLN
INTRAVENOUS | Status: DC
  Administered 2018-03-09 (×2): via INTRAVENOUS

## 2018-03-09 MED ORDER — ONDANSETRON HCL 4 MG PO TABS: 4.0000 mg | ORAL_TABLET | Freq: Four times a day (QID) | ORAL | Status: DC | PRN

## 2018-03-09 MED ORDER — MAGNESIUM HYDROXIDE 400 MG/5ML PO SUSP
30.0000 mL | Freq: Every day | ORAL | Status: DC | PRN
  Administered 2018-03-10: 30 mL via ORAL
  Filled 2018-03-09: qty 30

## 2018-03-09 MED ORDER — NEOMYCIN-POLYMYXIN B GU 40-200000 IR SOLN
Status: AC
  Filled 2018-03-09: qty 20

## 2018-03-09 MED ORDER — TRANEXAMIC ACID 1000 MG/10ML IV SOLN
INTRAVENOUS | Status: DC | PRN
  Administered 2018-03-09: 1000 mg via TOPICAL

## 2018-03-09 MED ORDER — PROPOFOL 10 MG/ML IV BOLUS
INTRAVENOUS | Status: AC
  Filled 2018-03-09: qty 20

## 2018-03-09 MED ORDER — TRANEXAMIC ACID 1000 MG/10ML IV SOLN
INTRAVENOUS | Status: AC
  Filled 2018-03-09: qty 10

## 2018-03-09 MED ORDER — BISACODYL 10 MG RE SUPP: 10.0000 mg | Freq: Every day | RECTAL | Status: DC | PRN

## 2018-03-09 MED ORDER — PANTOPRAZOLE SODIUM 40 MG PO TBEC
40.0000 mg | DELAYED_RELEASE_TABLET | Freq: Every day | ORAL | Status: DC
  Administered 2018-03-09 – 2018-03-11 (×3): 40 mg via ORAL
  Filled 2018-03-09 (×3): qty 1

## 2018-03-09 MED ORDER — FENTANYL CITRATE (PF) 100 MCG/2ML IJ SOLN
INTRAMUSCULAR | Status: DC | PRN
  Administered 2018-03-09 (×2): 50 ug via INTRAVENOUS

## 2018-03-09 MED ORDER — PHENYLEPHRINE HCL 10 MG/ML IJ SOLN
INTRAMUSCULAR | Status: AC
  Filled 2018-03-09: qty 1

## 2018-03-09 MED ORDER — SODIUM CHLORIDE 0.9 % IJ SOLN
INTRAMUSCULAR | Status: AC
  Filled 2018-03-09: qty 50

## 2018-03-09 MED ORDER — FLEET ENEMA 7-19 GM/118ML RE ENEM: 1.0000 | ENEMA | Freq: Once | RECTAL | Status: DC | PRN

## 2018-03-09 MED ORDER — CEFAZOLIN SODIUM-DEXTROSE 2-4 GM/100ML-% IV SOLN
INTRAVENOUS | Status: AC
  Filled 2018-03-09: qty 100

## 2018-03-09 MED ORDER — PROPOFOL 500 MG/50ML IV EMUL
INTRAVENOUS | Status: DC | PRN
  Administered 2018-03-09: 70 ug/kg/min via INTRAVENOUS

## 2018-03-09 MED ORDER — MIDAZOLAM HCL 5 MG/5ML IJ SOLN
INTRAMUSCULAR | Status: DC | PRN
  Administered 2018-03-09: 2 mg via INTRAVENOUS

## 2018-03-09 MED ORDER — BUPIVACAINE-EPINEPHRINE (PF) 0.5% -1:200000 IJ SOLN
INTRAMUSCULAR | Status: DC | PRN
  Administered 2018-03-09: 30 mL via PERINEURAL

## 2018-03-09 MED ORDER — ONDANSETRON HCL 4 MG/2ML IJ SOLN: 4.0000 mg | Freq: Once | INTRAMUSCULAR | Status: DC | PRN

## 2018-03-09 MED ORDER — PROPOFOL 10 MG/ML IV BOLUS
INTRAVENOUS | Status: DC | PRN
  Administered 2018-03-09: 15 mg via INTRAVENOUS
  Administered 2018-03-09: 30 mg via INTRAVENOUS
  Administered 2018-03-09: 15 mg via INTRAVENOUS

## 2018-03-09 MED ORDER — CEFAZOLIN SODIUM-DEXTROSE 2-4 GM/100ML-% IV SOLN
2.0000 g | Freq: Four times a day (QID) | INTRAVENOUS | Status: AC
  Administered 2018-03-09 – 2018-03-10 (×3): 2 g via INTRAVENOUS
  Filled 2018-03-09 (×3): qty 100

## 2018-03-09 MED ORDER — NEOMYCIN-POLYMYXIN B GU 40-200000 IR SOLN
Status: DC | PRN
  Administered 2018-03-09: 14 mL

## 2018-03-09 MED ORDER — BUPIVACAINE HCL (PF) 0.5 % IJ SOLN
INTRAMUSCULAR | Status: DC | PRN
  Administered 2018-03-09: 3 mL

## 2018-03-09 MED ORDER — BUPIVACAINE-EPINEPHRINE (PF) 0.5% -1:200000 IJ SOLN
INTRAMUSCULAR | Status: AC
  Filled 2018-03-09: qty 30

## 2018-03-09 MED ORDER — SODIUM CHLORIDE 0.9 % IV SOLN
INTRAVENOUS | Status: DC | PRN
  Administered 2018-03-09: 30 ug/min via INTRAVENOUS

## 2018-03-09 MED ORDER — KETOROLAC TROMETHAMINE 30 MG/ML IJ SOLN
30.0000 mg | Freq: Once | INTRAMUSCULAR | Status: AC
  Administered 2018-03-09: 30 mg via INTRAVENOUS

## 2018-03-09 MED ORDER — ENOXAPARIN SODIUM 40 MG/0.4ML ~~LOC~~ SOLN
40.0000 mg | SUBCUTANEOUS | Status: DC
  Administered 2018-03-10 – 2018-03-11 (×2): 40 mg via SUBCUTANEOUS
  Filled 2018-03-09 (×2): qty 0.4

## 2018-03-09 SURGICAL SUPPLY — 66 items
BANDAGE ELASTIC 6 LF NS (GAUZE/BANDAGES/DRESSINGS) ×2 IMPLANT
BAR LOCKING TIBIAL (Orthopedic Implant) ×1 IMPLANT
BEARING TIBIAL VG AS 79X12 (Joint) ×1 IMPLANT
BIT DRILL QUICK REL 1/8 2PK SL (DRILL) ×1 IMPLANT
BLADE SAW SAG 25X90X1.19 (BLADE) ×2 IMPLANT
BLADE SURG SZ20 CARB STEEL (BLADE) ×2 IMPLANT
CANISTER SUCT 1200ML W/VALVE (MISCELLANEOUS) ×2 IMPLANT
CANISTER SUCT 3000ML PPV (MISCELLANEOUS) ×2 IMPLANT
CEMENT BONE R 1X40 (Cement) ×4 IMPLANT
CEMENT VACUUM MIXING SYSTEM (MISCELLANEOUS) ×2 IMPLANT
CHLORAPREP W/TINT 26ML (MISCELLANEOUS) ×2 IMPLANT
COMP FEMORAL CRUC RIGHT 75MM (Joint) ×2 IMPLANT
COMPONENT FEMRL CRUC RT 75MM (Joint) ×1 IMPLANT
COOLER POLAR GLACIER W/PUMP (MISCELLANEOUS) ×2 IMPLANT
COVER MAYO STAND STRL (DRAPES) ×2 IMPLANT
COVER WAND RF STERILE (DRAPES) ×2 IMPLANT
CUFF TOURN 24 STER (MISCELLANEOUS) IMPLANT
CUFF TOURN 30 STER DUAL PORT (MISCELLANEOUS) IMPLANT
CUFF TOURN 34 STER (MISCELLANEOUS) ×2 IMPLANT
DRAPE IMP U-DRAPE 54X76 (DRAPES) ×2 IMPLANT
DRAPE INCISE IOBAN 66X45 STRL (DRAPES) ×2 IMPLANT
DRAPE SHEET LG 3/4 BI-LAMINATE (DRAPES) ×2 IMPLANT
DRILL QUICK RELEASE 1/8 INCH (DRILL) ×1
DRSG OPSITE POSTOP 4X10 (GAUZE/BANDAGES/DRESSINGS) ×2 IMPLANT
DRSG OPSITE POSTOP 4X8 (GAUZE/BANDAGES/DRESSINGS) IMPLANT
ELECT CAUTERY BLADE 6.4 (BLADE) ×2 IMPLANT
ELECT REM PT RETURN 9FT ADLT (ELECTROSURGICAL) ×2
ELECTRODE REM PT RTRN 9FT ADLT (ELECTROSURGICAL) ×1 IMPLANT
GLOVE BIO SURGEON STRL SZ7.5 (GLOVE) ×8 IMPLANT
GLOVE BIO SURGEON STRL SZ8 (GLOVE) ×8 IMPLANT
GLOVE BIOGEL PI IND STRL 8 (GLOVE) ×1 IMPLANT
GLOVE BIOGEL PI INDICATOR 8 (GLOVE) ×1
GLOVE INDICATOR 8.0 STRL GRN (GLOVE) ×2 IMPLANT
GOWN STRL REUS W/ TWL LRG LVL3 (GOWN DISPOSABLE) ×1 IMPLANT
GOWN STRL REUS W/ TWL XL LVL3 (GOWN DISPOSABLE) ×1 IMPLANT
GOWN STRL REUS W/TWL LRG LVL3 (GOWN DISPOSABLE) ×1
GOWN STRL REUS W/TWL XL LVL3 (GOWN DISPOSABLE) ×1
HOLDER FOLEY CATH W/STRAP (MISCELLANEOUS) ×2 IMPLANT
HOOD PEEL AWAY FLYTE STAYCOOL (MISCELLANEOUS) ×8 IMPLANT
IMMBOLIZER KNEE 19 BLUE UNIV (SOFTGOODS) ×2 IMPLANT
KIT TURNOVER KIT A (KITS) ×2 IMPLANT
NDL SAFETY ECLIPSE 18X1.5 (NEEDLE) ×2 IMPLANT
NEEDLE HYPO 18GX1.5 SHARP (NEEDLE) ×2
NEEDLE SPNL 20GX3.5 QUINCKE YW (NEEDLE) ×2 IMPLANT
NS IRRIG 1000ML POUR BTL (IV SOLUTION) ×2 IMPLANT
PACK TOTAL KNEE (MISCELLANEOUS) ×2 IMPLANT
PAD WRAPON POLAR KNEE (MISCELLANEOUS) ×1 IMPLANT
PATELLA STD 34X8.5 (Orthopedic Implant) ×2 IMPLANT
PLATE KNEE TIBIAL 79MM FIXED (Plate) ×2 IMPLANT
PULSAVAC PLUS IRRIG FAN TIP (DISPOSABLE) ×2
SOL .9 NS 3000ML IRR  AL (IV SOLUTION) ×1
SOL .9 NS 3000ML IRR UROMATIC (IV SOLUTION) ×1 IMPLANT
STAPLER SKIN PROX 35W (STAPLE) ×2 IMPLANT
SUCTION FRAZIER HANDLE 10FR (MISCELLANEOUS) ×1
SUCTION TUBE FRAZIER 10FR DISP (MISCELLANEOUS) ×1 IMPLANT
SUT VIC AB 0 CT1 36 (SUTURE) ×6 IMPLANT
SUT VIC AB 2-0 CT1 27 (SUTURE) ×3
SUT VIC AB 2-0 CT1 TAPERPNT 27 (SUTURE) ×3 IMPLANT
SYR 10ML LL (SYRINGE) ×2 IMPLANT
SYR 20CC LL (SYRINGE) ×2 IMPLANT
SYR 30ML LL (SYRINGE) ×6 IMPLANT
TIBIAL BEARING VG AS 79X12 (Joint) ×2 IMPLANT
TIBIAL LOCKING BAR (Orthopedic Implant) ×2 IMPLANT
TIP FAN IRRIG PULSAVAC PLUS (DISPOSABLE) ×1 IMPLANT
TRAY FOLEY MTR SLVR 16FR STAT (SET/KITS/TRAYS/PACK) ×2 IMPLANT
WRAPON POLAR PAD KNEE (MISCELLANEOUS) ×2

## 2018-03-09 NOTE — H&P (Signed)
Paper H&P to be scanned into permanent record. H&P reviewed and patient re-examined. No changes. 

## 2018-03-09 NOTE — Anesthesia Procedure Notes (Signed)
Spinal  Patient location during procedure: OR Start time: 03/09/2018 7:40 AM End time: 03/09/2018 7:46 AM Staffing Resident/CRNA: Bernardo Heater, CRNA Performed: resident/CRNA  Preanesthetic Checklist Completed: patient identified, site marked, surgical consent, pre-op evaluation, timeout performed, IV checked, risks and benefits discussed and monitors and equipment checked Spinal Block Patient position: sitting Prep: ChloraPrep Patient monitoring: heart rate, continuous pulse ox, blood pressure and cardiac monitor Approach: midline Location: L3-4 Injection technique: single-shot Needle Needle type: Introducer and Pencan  Needle gauge: 24 G Needle length: 9 cm Additional Notes Negative paresthesia. Negative blood return. Positive free-flowing CSF. Expiration date of kit checked and confirmed. Patient tolerated procedure well, without complications.

## 2018-03-09 NOTE — Progress Notes (Signed)
PT Cancellation Note  Patient Details Name: Kathleen Hart MRN: [id number] DOB: March 12, 1951   Cancelled Treatment:    Reason Eval/Treat Not Completed: Medical issues which prohibited therapy(BP taken twice in supine: 88/61, 90/63).  Pt additionally reporting feeling lightheaded. RN notified.  Will hold PT until next date when pt will hopefully be more appropriate.   Collie Siad PT, DPT 03/09/2018, 3:14 PM

## 2018-03-09 NOTE — NC FL2 (Signed)
[place] LEVEL OF CARE SCREENING TOOL     IDENTIFICATION  Patient Name: Kathleen Hart Birthdate: 1950/10/26 Sex: female Admission Date (Current Location): 03/09/2018  [place] and [place] Number:  Engineering geologist and Address:  [facility], [address], [place], [place] [address]      Provider Number: [id number]  Attending Physician Name and Address:  Corky Mull, MD  Relative Name and Phone Number:  Kathleen Hart- husband [phone]    Current Level of Care: Hospital Recommended Level of Care: [place] Prior Approval Number:    Date Approved/Denied:   PASRR Number: [id number]  Discharge Plan: SNF    Current Diagnoses: Patient Active Problem List   Diagnosis Date Noted  . Status post total knee replacement using cement, right 03/09/2018  . Class 1 obesity without serious comorbidity with body mass index (BMI) of 33.0 to 33.9 in adult 03/03/2018  . Chronic fatigue 03/03/2018  . Osteoarthritis 10/18/2015  . Chronic pain 10/18/2015  . Gastroesophageal reflux disease 06/04/2015  . Colon polyps 06/04/2015    Orientation RESPIRATION BLADDER Height & Weight     Self, Time, Situation, Place  Normal Continent Weight: 228 lb 11.2 oz (103.7 kg) Height:  5\' 9"  (175.3 cm)  BEHAVIORAL SYMPTOMS/MOOD NEUROLOGICAL BOWEL NUTRITION STATUS  (none) (None) Continent Diet(Clear Liquid diet to be advanced )  AMBULATORY STATUS COMMUNICATION OF NEEDS Skin   Extensive Assist Verbally Normal                       Personal Care Assistance Level of Assistance  Bathing, Feeding, Dressing Bathing Assistance: Limited assistance Feeding assistance: Independent Dressing Assistance: Limited assistance     Functional Limitations Info  Sight, Hearing, Speech Sight Info: Adequate Hearing Info: Adequate Speech Info: Adequate    SPECIAL CARE FACTORS FREQUENCY  PT (By licensed PT), OT (By licensed OT)     PT  Frequency: 5 OT Frequency: 5            Contractures Contractures Info: Not present    Additional Factors Info  Code Status, Allergies Code Status Info: Full Code  Allergies Info: Sulfa Antibiotics           Current Medications (03/09/2018):  This is the current hospital active medication list Current Facility-Administered Medications  Medication Dose Route Frequency Provider Last Rate Last Dose  . 0.9 %  sodium chloride infusion   Intravenous Continuous Poggi, Marshall Cork, MD 75 mL/hr at 03/09/18 1244    . acetaminophen (TYLENOL) tablet 1,000 mg  1,000 mg Oral Q6H Poggi, Marshall Cork, MD   1,000 mg at 03/09/18 1234  . [START ON 03/10/2018] acetaminophen (TYLENOL) tablet 325-650 mg  325-650 mg Oral Q6H PRN Poggi, Marshall Cork, MD      . bisacodyl (DULCOLAX) suppository 10 mg  10 mg Rectal Daily PRN Poggi, Marshall Cork, MD      . ceFAZolin (ANCEF) IVPB 2g/100 mL premix  2 g Intravenous Q6H Poggi, Marshall Cork, MD 200 mL/hr at 03/09/18 1247 2 g at 03/09/18 1247  . diphenhydrAMINE (BENADRYL) 12.5 MG/5ML elixir 12.5-25 mg  12.5-25 mg Oral Q4H PRN Poggi, Marshall Cork, MD      . docusate sodium (COLACE) capsule 100 mg  100 mg Oral BID Poggi, Marshall Cork, MD   100 mg at 03/09/18 1234  . [START ON 03/10/2018] enoxaparin (LOVENOX) injection 40 mg  40 mg Subcutaneous Q24H Poggi, Marshall Cork, MD      .  HYDROmorphone (DILAUDID) injection 0.5-1 mg  0.5-1 mg Intravenous Q4H PRN Poggi, Marshall Cork, MD   1 mg at 03/09/18 1346  . ketorolac (TORADOL) 15 MG/ML injection 15 mg  15 mg Intravenous Q6H Poggi, Marshall Cork, MD   15 mg at 03/09/18 1234  . magnesium hydroxide (MILK OF MAGNESIA) suspension 30 mL  30 mL Oral Daily PRN Poggi, Marshall Cork, MD      . metoCLOPramide (REGLAN) tablet 5-10 mg  5-10 mg Oral Q8H PRN Poggi, Marshall Cork, MD       Or  . metoCLOPramide (REGLAN) injection 5-10 mg  5-10 mg Intravenous Q8H PRN Poggi, Marshall Cork, MD      . ondansetron (ZOFRAN) tablet 4 mg  4 mg Oral Q6H PRN Poggi, Marshall Cork, MD       Or  . ondansetron (ZOFRAN) injection 4 mg  4 mg  Intravenous Q6H PRN Poggi, Marshall Cork, MD      . oxyCODONE (Oxy IR/ROXICODONE) immediate release tablet 5-10 mg  5-10 mg Oral Q4H PRN Poggi, Marshall Cork, MD      . pantoprazole (PROTONIX) EC tablet 40 mg  40 mg Oral Daily Poggi, Marshall Cork, MD   40 mg at 03/09/18 1234  . sodium phosphate (FLEET) 7-19 GM/118ML enema 1 enema  1 enema Rectal Once PRN Poggi, Marshall Cork, MD      . traMADol Veatrice Bourbon) tablet 50 mg  50 mg Oral Q6H PRN Poggi, Marshall Cork, MD         Discharge Medications: Please see discharge summary for a list of discharge medications.  Relevant Imaging Results:  Relevant Lab Results:   Additional Information SSn: [id number]  Annamaria Boots, [place]

## 2018-03-09 NOTE — Op Note (Signed)
03/09/2018  10:22 AM  Patient:   Kathleen Hart  Pre-Op Diagnosis:   Degenerative joint disease, right knee.  Post-Op Diagnosis:   Same  Procedure:   Right TKA using all-cemented [employer] Vanguard system with a 75 mm PCR femur, a 79 mm tibial tray with a 12 mm AS E-poly insert, and a 34 x 8.5 mm all-poly 3-pegged domed patella.  Surgeon:   Pascal Lux, MD  Assistant:   Cameron Proud, PA-C; Phoebe Sharps, PA-S  Anesthesia:   Spinal  Findings:   As above  Complications:   None  EBL:   10 cc  Fluids:   950 cc crystalloid  UOP:   700 cc  TT:   100 minutes at 300 mmHg  Drains:   None  Closure:   Staples  Implants:   As above  Brief Clinical Note:   The patient is a 67 year old female with a long history of progressively worsening right knee pain. The patient's symptoms have progressed despite medications, activity modification, injections, etc. The patient's history and examination were consistent with advanced degenerative joint disease of the right knee confirmed by plain radiographs. The patient presents at this time for a right total knee arthroplasty.  Procedure:   The patient was brought into the operating room. After adequate spinal anesthesia was obtained, the patient was lain in the supine position. A Foley catheter was placed by the nurse before the right lower extremity was prepped with ChloraPrep solution and draped sterilely. Preoperative antibiotics were administered. After verifying the proper laterality with a surgical timeout, the limb was exsanguinated with an Esmarch and the tourniquet inflated to 300 mmHg. A standard anterior approach to the knee was made through an approximately 7 inch incision. The incision was carried down through the subcutaneous tissues to expose superficial retinaculum. This was split the length of the incision and the medial flap elevated sufficiently to expose the medial retinaculum. The medial retinaculum was incised, leaving a 3-4  mm cuff of tissue on the patella. This was extended distally along the medial border of the patellar tendon and proximally through the medial third of the quadriceps tendon. A subtotal fat pad excision was performed before the soft tissues were elevated off the anteromedial and anterolateral aspects of the proximal tibia to the level of the collateral ligaments. The anterior portions of the medial and lateral menisci were removed, as was the anterior cruciate ligament. With the knee flexed to 90, the external tibial guide was positioned and the appropriate proximal tibial cut made. This piece was taken to the back table where it was measured and found to be optimally replicated by a 79 mm component.  Attention was directed to the distal femur. The intramedullary canal was accessed through a 3/8" drill hole. The intramedullary guide was inserted and positioned in order to obtain a neutral flexion gap. The intercondylar block was positioned with care taken to avoid notching the anterior cortex of the femur. The appropriate cut was made. Next, the distal cutting block was placed at 6 of valgus alignment. Using the 9 mm slot, the distal cut was made. The distal femur was measured and found to be optimally replicated by the 75 mm component. The 75 mm 4-in-1 cutting block was positioned and first the posterior, then the posterior chamfer, the anterior chamfer, and finally the anterior cuts were made. At this point, the posterior portions medial and lateral menisci were removed. A trial reduction was performed using the appropriate femoral and tibial components  with first the 10 mm and then the 12 mm insert. The 12 mm insert demonstrated excellent stability to varus and valgus stressing both in flexion and extension while permitting full extension. Patella tracking was assessed and found to be excellent. Therefore, the tibial guide position was marked on the proximal tibia. The patella thickness was measured and found  to be 23 mm. Therefore, the appropriate cut was made. The patellar surface was measured and found to be optimally replicated by the 34 mm component. The three peg holes were drilled in place before the trial button was inserted. Patella tracking was assessed and found to be excellent, passing the "no thumb test". The lug holes were drilled into the distal femur before the trial component was removed, leaving only the tibial tray. The keel was then created using the appropriate tower, reamer, and punch.  The bony surfaces were prepared for cementing by irrigating thoroughly with bacitracin saline solution. A bone plug was fashioned from some of the bone that had been removed previously and used to plug the distal femoral canal. In addition, 20 cc of Exparel diluted out to 60 cc with normal saline and 30 cc of 0.5% Sensorcaine were injected into the postero-medial and postero-lateral aspects of the knee, the medial and lateral gutter regions, and the peri-incisional tissues to help with postoperative analgesia. Meanwhile, the cement was being mixed on the back table. When it was ready, the tibial tray was cemented in first. The excess cement was removed using [employer] fast streamer. Next, the femoral component was impacted into place. Again, the excess cement was removed using [employer] fast streamer. The 12 mm trial insert was positioned and the knee brought into extension while the cement hardened. Finally, the patella was cemented into place and secured using the patellar clamp. Again, the excess cement was removed using [employer] fast streamer. Once the cement had hardened, the knee was placed through a range of motion with the findings as described above. Therefore, the trial insert was removed and, after verifying that no cement had been retained posteriorly, the permanent 12 mm anterior stabilized E-polyethylene insert was positioned and secured using the appropriate key locking mechanism.   The knee was placed through a range of  motion, but there was a reproducible catching sensation in the posterior lateral aspect of the knee. This was felt to be due to the popliteus tendon rubbing over the posterior lateral edge of the femoral component. Therefore, the e- polyethylene insert was removed and the popliteus tendon released from its femoral attachment. The permanent 12 mm anterior stabilized e- polyethylene insert was repositioned and secured using a new locking key. Again the knee was placed through a range of motion. This time, the knee demonstrate excellent range of motion without any catching. Patella tracking was excellent, and the knee was stable to varus and valgus stressing both in flexion and extension, as well as an anterior posterior displacement.  The wound was copiously irrigated with bacitracin saline solution using the jet lavage system before the quadriceps tendon and retinacular layer were reapproximated using #0 Vicryl interrupted sutures. The superficial retinacular layer also was closed using a running #0 Vicryl suture. A total of 10 cc of transexemic acid (TXA) was injected intra-articularly before the subcutaneous tissues were closed in several layers using 2-0 Vicryl interrupted sutures. The skin was closed using staples. A sterile honeycomb dressing was applied to the skin before the leg was wrapped with an Ace wrap to accommodate the Polar Care device. The patient was then  awakened and returned to the recovery room in satisfactory condition after tolerating the procedure well.

## 2018-03-09 NOTE — Anesthesia Preprocedure Evaluation (Signed)
Anesthesia Evaluation  Patient identified by MRN, date of birth, ID band Patient awake    Reviewed: Allergy & Precautions, NPO status , Patient's Chart, lab work & pertinent test results  Airway Mallampati: I       Dental  (+) Teeth Intact   Pulmonary neg pulmonary ROS,    Pulmonary exam normal        Cardiovascular + Valvular Problems/Murmurs  Rhythm:Regular     Neuro/Psych negative neurological ROS  negative psych ROS   GI/Hepatic Neg liver ROS, GERD  ,  Endo/Other  negative endocrine ROS  Renal/GU negative Renal ROS     Musculoskeletal  (+) Arthritis , Osteoarthritis,    Abdominal Normal abdominal exam  (+)   Peds  Hematology negative hematology ROS (+)   Anesthesia Other Findings   Reproductive/Obstetrics                             Anesthesia Physical  Anesthesia Plan  ASA: II  Anesthesia Plan: Spinal   Post-op Pain Management:    Induction: Intravenous  PONV Risk Score and Plan:   Airway Management Planned: Nasal Cannula  Additional Equipment:   Intra-op Plan:   Post-operative Plan:   Informed Consent: I have reviewed the patients History and Physical, chart, labs and discussed the procedure including the risks, benefits and alternatives for the proposed anesthesia with the patient or authorized representative who has indicated his/her understanding and acceptance.     Plan Discussed with: CRNA  Anesthesia Plan Comments:         Anesthesia Quick Evaluation

## 2018-03-09 NOTE — Transfer of Care (Signed)
Immediate Anesthesia Transfer of Care Note  Patient: Kathleen Hart  Procedure(s) Performed: TOTAL KNEE ARTHROPLASTY (Right Knee)  Patient Location: PACU  Anesthesia Type:Spinal  Level of Consciousness: awake, alert , oriented and patient cooperative  Airway & Oxygen Therapy: Patient Spontanous Breathing and Patient connected to nasal cannula oxygen  Post-op Assessment: Report given to RN and Post -op Vital signs reviewed and stable  Post vital signs: Reviewed and stable  Last Vitals:  Vitals Value Taken Time  BP 94/67 03/09/2018 10:18 AM  Temp    Pulse 83 03/09/2018 10:18 AM  Resp 13 03/09/2018 10:18 AM  SpO2 96 % 03/09/2018 10:18 AM  Vitals shown include unvalidated device data.  Last Pain:  Vitals:   03/09/18 0604  TempSrc: Temporal  PainSc: 0-No pain         Complications: No apparent anesthesia complications

## 2018-03-09 NOTE — Anesthesia Post-op Follow-up Note (Signed)
Anesthesia QCDR form completed.        

## 2018-03-10 ENCOUNTER — Encounter: Payer: Self-pay | Admitting: Surgery

## 2018-03-10 DIAGNOSIS — M1711 Unilateral primary osteoarthritis, right knee: Secondary | ICD-10-CM | POA: Diagnosis not present

## 2018-03-10 DIAGNOSIS — Z96659 Presence of unspecified artificial knee joint: Secondary | ICD-10-CM

## 2018-03-10 LAB — CBC WITH DIFFERENTIAL/PLATELET
ABS IMMATURE GRANULOCYTES: 0.05 10*3/uL (ref 0.00–0.07)
BASOS ABS: 0 10*3/uL (ref 0.0–0.1)
Basophils Relative: 0 %
Eosinophils Absolute: 0.2 10*3/uL (ref 0.0–0.5)
Eosinophils Relative: 2 %
HCT: 35.3 % — ABNORMAL LOW (ref 36.0–46.0)
Hemoglobin: 11.3 g/dL — ABNORMAL LOW (ref 12.0–15.0)
IMMATURE GRANULOCYTES: 1 %
Lymphocytes Relative: 18 %
Lymphs Abs: 1.7 10*3/uL (ref 0.7–4.0)
MCH: 28.6 pg (ref 26.0–34.0)
MCHC: 32 g/dL (ref 30.0–36.0)
MCV: 89.4 fL (ref 80.0–100.0)
MONO ABS: 1 10*3/uL (ref 0.1–1.0)
Monocytes Relative: 10 %
NEUTROS PCT: 69 %
NRBC: 0 % (ref 0.0–0.2)
Neutro Abs: 6.5 10*3/uL (ref 1.7–7.7)
PLATELETS: 157 10*3/uL (ref 150–400)
RBC: 3.95 MIL/uL (ref 3.87–5.11)
RDW: 12.9 % (ref 11.5–15.5)
WBC: 9.5 10*3/uL (ref 4.0–10.5)

## 2018-03-10 LAB — BASIC METABOLIC PANEL
Anion gap: 5 (ref 5–15)
BUN: 17 mg/dL (ref 8–23)
CO2: 27 mmol/L (ref 22–32)
Calcium: 8.4 mg/dL — ABNORMAL LOW (ref 8.9–10.3)
Chloride: 107 mmol/L (ref 98–111)
Creatinine, Ser: 0.66 mg/dL (ref 0.44–1.00)
GFR calc Af Amer: >60 mL/min (ref >60)
Glucose, Bld: 122 mg/dL — ABNORMAL HIGH (ref 70–99)
POTASSIUM: 3.6 mmol/L (ref 3.5–5.1)
SODIUM: 139 mmol/L (ref 135–145)

## 2018-03-10 NOTE — Care Management CC44 (Signed)
Condition Code 44 Documentation Completed  Patient Details  Name: Kathleen Hart MRN: [id number] Date of Birth: Dec 21, 1950   Condition Code 44 given:   yes Patient signature on Condition Code 44 notice:   yes Documentation of 2 MD's agreement:   yes Code 44 added to claim:   yes    Shelbie Ammons, RN 03/10/2018, 3:41 PM

## 2018-03-10 NOTE — Evaluation (Signed)
Physical Therapy Evaluation Patient Details Name: Kathleen Hart MRN: [id number] DOB: Sep 25, 1950 Today's Date: 03/10/2018   History of Present Illness  Pt is a 67 y/o F s/p R TKA.  Pt's PMH includes chronic back pain.   Clinical Impression  Pt is s/p R TKA resulting in the deficits listed below (see PT Problem List). Kathleen Hart required assist SLR but functionally did not demonstrate knee buckle.  She ambulated 120 ft with RW with cues for proper gait mechanics and proper RW management.  Pt at min guard assist level with bed mobility, sit<>stand transfers, and ambulation.  Pt will benefit from skilled PT to increase their independence and safety with mobility to allow discharge to the venue listed below.     Follow Up Recommendations Home health PT    Equipment Recommendations  Rolling walker with 5" wheels    Recommendations for Other Services       Precautions / Restrictions Precautions Precautions: Fall;Knee Precaution Booklet Issued: Yes (comment) Precaution Comments: Instructed pt in no pillow under knee Restrictions Weight Bearing Restrictions: Yes RLE Weight Bearing: Weight bearing as tolerated      Mobility  Bed Mobility Overal bed mobility: Needs Assistance Bed Mobility: Supine to Sit     Supine to sit: Min guard;HOB elevated     General bed mobility comments: Increased time and effort but no physical assist or cues needed.  Transfers Overall transfer level: Needs assistance Equipment used: Rolling walker (2 wheeled) Transfers: Sit to/from Stand Sit to Stand: Min guard         General transfer comment: Cues for proper hand placement and safe technique.  Pt rises slowly.  Well controlled descent to sit.   Ambulation/Gait Ambulation/Gait assistance: Min guard Gait Distance (Feet): 120 Feet Assistive device: Rolling walker (2 wheeled) Gait Pattern/deviations: Step-to pattern;Step-through pattern;Decreased step length - left;Decreased stance time -  right;Decreased weight shift to right;Antalgic Gait velocity: decreased   General Gait Details: Pt pushing RW too far ahead at times which improves with verbal cues.  Initially pt demonstrates step to pattern but progresses to step through without cues.  Cues provided to allow R knee F at terminal stance.   Stairs            Wheelchair Mobility    Modified Rankin (Stroke Patients Only)       Balance Overall balance assessment: Needs assistance Sitting-balance support: No upper extremity supported;Feet supported Sitting balance-Leahy Scale: Good     Standing balance support: Single extremity supported;During functional activity Standing balance-Leahy Scale: Poor Standing balance comment: Pt relies on at least 1UE support for static and dynamic activities                             Pertinent Vitals/Pain Pain Assessment: 0-10 Pain Score: 4  Pain Location: R knee Pain Descriptors / Indicators: Aching Pain Intervention(s): Limited activity within patient's tolerance;Monitored during session;Premedicated before session;Repositioned    Home Living Family/patient expects to be discharged to:: Private residence Living Arrangements: Spouse/significant other Available Help at Discharge: Family;Available 24 hours/day Type of Home: House Home Access: Stairs to enter Entrance Stairs-Rails: Left;Right;Can reach both Entrance Stairs-Number of Steps: 3 Home Layout: One level(and basement with a den) Home Equipment: Toilet riser;Shower seat;Other (comment)(knee scooter)      Prior Function Level of Independence: Independent         Comments: Ambulating without AD.  Ind with ADLs and still driving.  No falls in the  past 6 months.  Has fallen on the steps multiple times in the past.       Hand Dominance   Dominant Hand: Right    Extremity/Trunk Assessment   Upper Extremity Assessment Upper Extremity Assessment: Overall WFL for tasks assessed    Lower  Extremity Assessment Lower Extremity Assessment: RLE deficits/detail RLE Deficits / Details: Pt requires assist to performs SLR x10 with improving strength with more reps.  Functionally no knee buckle.  RLE: Unable to fully assess due to pain       Communication   Communication: No difficulties  Cognition Arousal/Alertness: Awake/alert Behavior During Therapy: WFL for tasks assessed/performed Overall Cognitive Status: Within Functional Limits for tasks assessed                                        General Comments General comments (skin integrity, edema, etc.): BP taken in supine at start of session in supine: 104/75    Exercises Total Joint Exercises Ankle Circles/Pumps: AROM;Both;10 reps;Supine Quad Sets: Strengthening;Right;10 reps;Supine Straight Leg Raises: Strengthening;AAROM;Right;10 reps;Supine Knee Flexion: AAROM;Right;5 reps;Seated;Other (comment)(with 5 sec holds) Goniometric ROM: -1 to 84 deg Marching in Standing: Both;10 reps;Standing   Assessment/Plan    PT Assessment Patient needs continued PT services  PT Problem List Decreased strength;Decreased range of motion;Decreased activity tolerance;Decreased balance;Decreased knowledge of use of DME;Decreased safety awareness;Decreased knowledge of precautions;Pain       PT Treatment Interventions DME instruction;Gait training;Stair training;Functional mobility training;Therapeutic activities;Therapeutic exercise;Balance training;Neuromuscular re-education;Patient/family education;Modalities    PT Goals (Current goals can be found in the Care Plan section)  Acute Rehab PT Goals Patient Stated Goal: to get back to step aerobics class PT Goal Formulation: With patient Time For Goal Achievement: 03/24/18 Potential to Achieve Goals: Good    Frequency BID   Barriers to discharge        Co-evaluation               AM-PAC PT "6 Clicks" Daily Activity  Outcome Measure Difficulty turning  over in bed (including adjusting bedclothes, sheets and blankets)?: A Little Difficulty moving from lying on back to sitting on the side of the bed? : A Little Difficulty sitting down on and standing up from a chair with arms (e.g., wheelchair, bedside commode, etc,.)?: A Little Help needed moving to and from a bed to chair (including a wheelchair)?: A Little Help needed walking in hospital room?: A Little Help needed climbing 3-5 steps with a railing? : A Little 6 Click Score: 18    End of Session Equipment Utilized During Treatment: Gait belt Activity Tolerance: Patient tolerated treatment well Patient left: in chair;with call bell/phone within reach;with chair alarm set Nurse Communication: Mobility status PT Visit Diagnosis: Pain;Unsteadiness on feet (R26.81);Other abnormalities of gait and mobility (R26.89);Muscle weakness (generalized) (M62.81) Pain - Right/Left: Right Pain - part of body: Knee    Time: 8786-7672 PT Time Calculation (min) (ACUTE ONLY): 27 min   Charges:   PT Evaluation $PT Eval Moderate Complexity: 1 Mod PT Treatments $Therapeutic Exercise: 8-22 mins        Collie Siad PT, DPT 03/10/2018, 1:18 PM

## 2018-03-10 NOTE — Anesthesia Postprocedure Evaluation (Signed)
Anesthesia Post Note  Patient: Kathleen Hart  Procedure(s) Performed: TOTAL KNEE ARTHROPLASTY (Right Knee)  Patient location during evaluation: Nursing Unit Anesthesia Type: Spinal Level of consciousness: oriented and awake and alert Pain management: pain level controlled Vital Signs Assessment: post-procedure vital signs reviewed and stable Respiratory status: spontaneous breathing, respiratory function stable and patient connected to nasal cannula oxygen Cardiovascular status: blood pressure returned to baseline and stable Postop Assessment: no headache, no backache and no apparent nausea or vomiting Anesthetic complications: no     Last Vitals:  Vitals:   03/10/18 0432 03/10/18 0812  BP: 102/65 104/65  Pulse: 60 61  Resp: 16   Temp: 36.6 C 36.5 C  SpO2: 96% 97%    Last Pain:  Vitals:   03/10/18 0812  TempSrc: Oral  PainSc:                  Kathleen Hart

## 2018-03-10 NOTE — Care Management Obs Status (Signed)
[place] NOTIFICATION   Patient Details  Name: Kathleen Hart MRN: [id number] Date of Birth: February 12, 1951   Medicare Observation Status Notification Given:  Yes: Explained to patient    Shelbie Ammons, RN 03/10/2018, 3:41 PM

## 2018-03-10 NOTE — Progress Notes (Signed)
  Subjective: 1 Day Post-Op Procedure(s) (LRB): TOTAL KNEE ARTHROPLASTY (Right) Patient reports pain as 6 on 0-10 scale.   Patient is well, and has had no acute complaints or problems PT and care management to assist with discharge planning. Negative for chest pain and shortness of breath Fever: no Gastrointestinal:Negative for nausea and vomiting  Objective: Vital signs in last 24 hours: Temp:  [96.9 F (36.1 C)-98.2 F (36.8 C)] 97.8 F (36.6 C) (11/06 0432) Pulse Rate:  [55-80] 60 (11/06 0432) Resp:  [10-18] 16 (11/06 0432) BP: (92-117)/(61-81) 102/65 (11/06 0432) SpO2:  [96 %-100 %] 96 % (11/06 0432)  Intake/Output from previous day:  Intake/Output Summary (Last 24 hours) at 03/10/2018 0731 Last data filed at 03/10/2018 0321 Gross per 24 hour  Intake 2067.92 ml  Output 1460 ml  Net 607.92 ml    Intake/Output this shift: No intake/output data recorded.  Labs: Recent Labs    03/10/18 0339  HGB 11.3*   Recent Labs    03/10/18 0339  WBC 9.5  RBC 3.95  HCT 35.3*  PLT 157   Recent Labs    03/10/18 0339  NA 139  K 3.6  CL 107  CO2 27  BUN 17  CREATININE 0.66  GLUCOSE 122*  CALCIUM 8.4*   No results for input(s): LABPT, INR in the last 72 hours.   EXAM General - Patient is Alert, Appropriate and Oriented Extremity - ABD soft Sensation intact distally Intact pulses distally Dorsiflexion/Plantar flexion intact Incision: dressing C/D/I No cellulitis present Dressing/Incision - clean, dry, no drainage Motor Function - intact, moving foot and toes well on exam.  Abdomen soft with normal BS.  No tympany.  Past Medical History:  Diagnosis Date  . Chronic back pain   . Colon polyps   . GERD (gastroesophageal reflux disease)   . Heart murmur   . Osteoarthritis     Assessment/Plan: 1 Day Post-Op Procedure(s) (LRB): TOTAL KNEE ARTHROPLASTY (Right) Active Problems:   Status post total knee replacement using cement, right  Estimated body mass  index is 33.77 kg/m as calculated from the following:   Height as of this encounter: 5\' 9"  (1.753 m).   Weight as of this encounter: 103.7 kg. Advance diet Up with therapy   Labs reviewed this AM, Hg 11.3. Low BP yesterday, BP 102/65 this AM.  Continue to run IVF at this time, can d/c later today when increasing po intake. Up with therapy today. Begin working on [employer], patient states that she is passing gas without pain. Will plan for possible discharge tomorrow pending progress with PT today.  DVT Prophylaxis - Lovenox, Foot Pumps and TED hose Weight-Bearing as tolerated to right leg  J. Cameron Proud, PA-C [facility] 03/10/2018, 7:31 AM

## 2018-03-10 NOTE — Progress Notes (Signed)
Physical Therapy Treatment Patient Details Name: Kathleen Hart MRN: [id number] DOB: [birth year] Today's Date: 03/10/2018    History of Present Illness Pt is a 67 y/o F s/p R TKA.  Pt's PMH includes chronic back pain.     PT Comments    Pt agreeable to PT; reports 4/10 pain R knee. All mobility improving nicely with Mod I bed mobility. SBA/min guard for changing into pt's own clothes. Transfers with Min guard and ambulation/stair climbing with Min guard and cues for improved technique. Rolling walker lowered 1 notch for improved fit/comfort and ability to bear weight with UEs. Also encouraged improved use of RLE with sit, which pt able to demonstrate well. Good understanding of knee flexion/ext stretching. Continue PT to progress all functional mobility to allow for an optimal, safe transition home.    Follow Up Recommendations  Home health PT     Equipment Recommendations  Rolling walker with 5" wheels    Recommendations for Other Services       Precautions / Restrictions Precautions Precautions: Fall;Knee Precaution Booklet Issued: Yes (comment) Precaution Comments: Instructed pt in no pillow under knee Restrictions Weight Bearing Restrictions: Yes RLE Weight Bearing: Weight bearing as tolerated    Mobility  Bed Mobility Overal bed mobility: Modified Independent Bed Mobility: Supine to Sit     Supine to sit: Modified independent (Device/Increase time)     General bed mobility comments: Increased time and effort but no physical assist or cues needed.  Transfers Overall transfer level: Needs assistance Equipment used: Rolling walker (2 wheeled) Transfers: Sit to/from Stand Sit to Stand: Min guard         General transfer comment: cues for increased use of R knee for descent  Ambulation/Gait Ambulation/Gait assistance: Min guard Gait Distance (Feet): 285 Feet Assistive device: Rolling walker (2 wheeled) Gait Pattern/deviations: Step-through pattern Gait  velocity: decreased Gait velocity interpretation: <1.8 ft/sec, indicate of risk for recurrent falls General Gait Details: improved from step to to step through   Stairs Stairs: Yes Stairs assistance: Min guard Stair Management: One rail Right;Sideways;Step to pattern Number of Stairs: 4 General stair comments: cues for technique; understands well.   Wheelchair Mobility    Modified Rankin (Stroke Patients Only)       Balance Overall balance assessment: Needs assistance Sitting-balance support: No upper extremity supported;Feet supported Sitting balance-Leahy Scale: Good     Standing balance support: Bilateral upper extremity supported Standing balance-Leahy Scale: Fair Standing balance comment: Pt relies on at least 1UE support for static and dynamic activities                            Cognition Arousal/Alertness: Awake/alert Behavior During Therapy: WFL for tasks assessed/performed Overall Cognitive Status: Within Functional Limits for tasks assessed                                        Exercises Total Joint Exercises Ankle Circles/Pumps: AROM;Both;10 reps;Supine Quad Sets: Strengthening;Both;20 reps Straight Leg Raises: Strengthening;AAROM;Right;10 reps;Supine Knee Flexion: AROM;Right;10 reps;Seated(3 positions each rep with 10 sec hold ea) Goniometric ROM: -1 to 84 deg Marching in Standing: Both;10 reps;Standing    General Comments General comments (skin integrity, edema, etc.): BP taken in supine at start of session in supine: 104/75      Pertinent Vitals/Pain Pain Assessment: 0-10 Pain Score: 4  Pain Location: R knee Pain  Descriptors / Indicators: Constant;Aching Pain Intervention(s): Monitored during session;Repositioned;Ice applied    Home Living Family/patient expects to be discharged to:: Private residence Living Arrangements: Spouse/significant other Available Help at Discharge: Family;Available 24 hours/day Type of  Home: House Home Access: Stairs to enter Entrance Stairs-Rails: Left;Right;Can reach both Home Layout: One level(and basement with a den) Home Equipment: Toilet riser;Shower seat;Other (comment)(knee scooter)      Prior Function Level of Independence: Independent      Comments: Ambulating without AD.  Ind with ADLs and still driving.  No falls in the past 6 months.  Has fallen on the steps multiple times in the past.     PT Goals (current goals can now be found in the care plan section) Acute Rehab PT Goals Patient Stated Goal: to get back to step aerobics class PT Goal Formulation: With patient Time For Goal Achievement: 03/24/18 Potential to Achieve Goals: Good Progress towards PT goals: Progressing toward goals    Frequency    BID      PT Plan Current plan remains appropriate    Co-evaluation              AM-PAC PT "6 Clicks" Daily Activity  Outcome Measure  Difficulty turning over in bed (including adjusting bedclothes, sheets and blankets)?: A Little Difficulty moving from lying on back to sitting on the side of the bed? : A Little Difficulty sitting down on and standing up from a chair with arms (e.g., wheelchair, bedside commode, etc,.)?: Unable Help needed moving to and from a bed to chair (including a wheelchair)?: A Little Help needed walking in hospital room?: A Little Help needed climbing 3-5 steps with a railing? : A Little 6 Click Score: 16    End of Session Equipment Utilized During Treatment: Gait belt Activity Tolerance: Patient tolerated treatment well Patient left: in chair;with call bell/phone within reach;with SCD's reapplied;Other (comment)(polar care; refuses alarm, will call) Nurse Communication: Mobility status PT Visit Diagnosis: Pain;Unsteadiness on feet (R26.81);Other abnormalities of gait and mobility (R26.89);Muscle weakness (generalized) (M62.81) Pain - Right/Left: Right Pain - part of body: Knee     Time: 3614-4315 PT Time  Calculation (min) (ACUTE ONLY): 41 min  Charges:  $Gait Training: 23-37 mins $Therapeutic Exercise: 8-22 mins                      Larae Grooms, PTA 03/10/2018, 4:56 PM

## 2018-03-10 NOTE — Progress Notes (Signed)
Clinical Social Worker (CSW) received SNF consult. PT is recommending home health. RN case manager aware of above. Please reconsult if future social work needs arise. CSW signing off.   Ludean Duhart, LCSW [phone] 

## 2018-03-11 DIAGNOSIS — M1711 Unilateral primary osteoarthritis, right knee: Secondary | ICD-10-CM | POA: Diagnosis not present

## 2018-03-11 LAB — URINALYSIS, COMPLETE (UACMP) WITH MICROSCOPIC
BACTERIA UA: NONE SEEN
Bilirubin Urine: NEGATIVE
Glucose, UA: NEGATIVE mg/dL
KETONES UR: NEGATIVE mg/dL
LEUKOCYTES UA: NEGATIVE
Nitrite: NEGATIVE
PROTEIN: NEGATIVE mg/dL
Specific Gravity, Urine: 1.004 — ABNORMAL LOW (ref 1.005–1.030)
pH: 7 (ref 5.0–8.0)

## 2018-03-11 LAB — CBC
HEMATOCRIT: 34 % — AB (ref 36.0–46.0)
Hemoglobin: 11.2 g/dL — ABNORMAL LOW (ref 12.0–15.0)
MCH: 28.7 pg (ref 26.0–34.0)
MCHC: 32.9 g/dL (ref 30.0–36.0)
MCV: 87.2 fL (ref 80.0–100.0)
NRBC: 0 % (ref 0.0–0.2)
Platelets: 163 10*3/uL (ref 150–400)
RBC: 3.9 MIL/uL (ref 3.87–5.11)
RDW: 13.1 % (ref 11.5–15.5)
WBC: 12.4 10*3/uL — ABNORMAL HIGH (ref 4.0–10.5)

## 2018-03-11 LAB — BASIC METABOLIC PANEL
Anion gap: 5 (ref 5–15)
BUN: 12 mg/dL (ref 8–23)
CALCIUM: 8.4 mg/dL — AB (ref 8.9–10.3)
CO2: 27 mmol/L (ref 22–32)
CREATININE: 0.69 mg/dL (ref 0.44–1.00)
Chloride: 105 mmol/L (ref 98–111)
Glucose, Bld: 124 mg/dL — ABNORMAL HIGH (ref 70–99)
Potassium: 3.7 mmol/L (ref 3.5–5.1)
SODIUM: 137 mmol/L (ref 135–145)

## 2018-03-11 MED ORDER — TRAMADOL HCL 50 MG PO TABS: 50.0000 mg | ORAL_TABLET | Freq: Four times a day (QID) | ORAL | 0 refills | Status: DC | PRN

## 2018-03-11 MED ORDER — ENOXAPARIN SODIUM 40 MG/0.4ML ~~LOC~~ SOLN: 40.0000 mg | SUBCUTANEOUS | 0 refills | Status: DC

## 2018-03-11 MED ORDER — OXYCODONE HCL 5 MG PO TABS: 5.0000 mg | ORAL_TABLET | ORAL | 0 refills | Status: DC | PRN

## 2018-03-11 NOTE — Progress Notes (Signed)
Physical Therapy Treatment Patient Details Name: Kathleen Hart MRN: [id number] DOB: [birth year] Today's Date: 03/11/2018    History of Present Illness Pt is a 67 y/o F s/p R TKA.  Pt's PMH includes chronic back pain.     PT Comments    Pt was able to walk around unit and complete exercises with ease.  No LOB or buckling.  Voices no concerns with mobility. Anticipate discharge today.  Follow Up Recommendations  Home health PT     Equipment Recommendations  Rolling walker with 5" wheels    Recommendations for Other Services       Precautions / Restrictions Precautions Precautions: Fall;Knee Restrictions Weight Bearing Restrictions: Yes RLE Weight Bearing: Weight bearing as tolerated    Mobility  Bed Mobility Overal bed mobility: Modified Independent Bed Mobility: Supine to Sit     Supine to sit: Modified independent (Device/Increase time)     General bed mobility comments: Increased time and effort but no physical assist or cues needed.  Transfers Overall transfer level: Needs assistance Equipment used: Rolling walker (2 wheeled) Transfers: Sit to/from Stand Sit to Stand: Min guard;Supervision            Ambulation/Gait Ambulation/Gait assistance: Min Gaffer (Feet): 180 Feet Assistive device: Rolling walker (2 wheeled) Gait Pattern/deviations: Step-through pattern Gait velocity: decreased Gait velocity interpretation: <1.8 ft/sec, indicate of risk for recurrent falls General Gait Details: improved from step to to step through   [employer] stair comments: deferred stair traning - stated she was comfortable with them.   Wheelchair Mobility    Modified Rankin (Stroke Patients Only)       Balance Overall balance assessment: Needs assistance Sitting-balance support: No upper extremity supported;Feet supported Sitting balance-Leahy Scale: Good     Standing balance support: Bilateral upper extremity  supported Standing balance-Leahy Scale: Fair Standing balance comment: Pt relies on at least 1UE support for static and dynamic activities                            Cognition Arousal/Alertness: Awake/alert Behavior During Therapy: WFL for tasks assessed/performed Overall Cognitive Status: Within Functional Limits for tasks assessed                                        Exercises Total Joint Exercises Goniometric ROM: 2-82 - limited by pain Other Exercises Other Exercises: LAQ, knee flexion in sitting, SLR, ankle pumps, quad sets, and heel slides in supine x 10    General Comments        Pertinent Vitals/Pain Pain Assessment: 0-10 Pain Score: 5  Pain Location: R knee Pain Descriptors / Indicators: Constant;Aching Pain Intervention(s): Monitored during session;Limited activity within patient's tolerance;Premedicated before session;Ice applied    Home Living                      Prior Function            PT Goals (current goals can now be found in the care plan section) Progress towards PT goals: Progressing toward goals    Frequency    BID      PT Plan Current plan remains appropriate    Co-evaluation              AM-PAC PT "6 Clicks" Daily Activity  Outcome Measure  Difficulty turning over in bed (including adjusting bedclothes, sheets and blankets)?: None Difficulty moving from lying on back to sitting on the side of the bed? : None Difficulty sitting down on and standing up from a chair with arms (e.g., wheelchair, bedside commode, etc,.)?: None Help needed moving to and from a bed to chair (including a wheelchair)?: None Help needed walking in hospital room?: A Little Help needed climbing 3-5 steps with a railing? : A Little 6 Click Score: 22    End of Session Equipment Utilized During Treatment: Gait belt Activity Tolerance: Patient tolerated treatment well Patient left: in bed;with call bell/phone within  reach;with bed alarm set   Pain - Right/Left: Right Pain - part of body: Knee     Time: 8563-1497 PT Time Calculation (min) (ACUTE ONLY): 16 min  Charges:  $Gait Training: 8-22 mins                     Chesley Noon, PTA 03/11/18, 10:41 AM

## 2018-03-11 NOTE — Discharge Summary (Signed)
Physician Discharge Summary  Patient ID: Kathleen Hart MRN: [id number] DOB/AGE: 06-17-1950 67 y.o.  Admit date: 03/09/2018 Discharge date: 03/11/2018  Admission Diagnoses:  primary osteoarthritis of right knee  Discharge Diagnoses: Patient Active Problem List   Diagnosis Date Noted  . Status post total knee replacement using cement 03/10/2018  . Status post total knee replacement using cement, right 03/09/2018  . Class 1 obesity without serious comorbidity with body mass index (BMI) of 33.0 to 33.9 in adult 03/03/2018  . Chronic fatigue 03/03/2018  . Osteoarthritis 10/18/2015  . Chronic pain 10/18/2015  . Gastroesophageal reflux disease 06/04/2015  . Colon polyps 06/04/2015    Past Medical History:  Diagnosis Date  . Chronic back pain   . Colon polyps   . GERD (gastroesophageal reflux disease)   . Heart murmur   . Osteoarthritis     Transfusion: None.   Consultants (if any):   Discharged Condition: Improved  Hospital Course: Kathleen Hart is an 67 y.o. female who was admitted 03/09/2018 with a diagnosis of primary osteoarthritis of the right knee and went to the operating room on 03/09/2018 and underwent the above named procedures.    Surgeries: Procedure(s): TOTAL KNEE ARTHROPLASTY on 03/09/2018 Patient tolerated the surgery well. Taken to PACU where she was stabilized and then transferred to the orthopedic floor.  Started on Lovenox 40mg  q 24 hrs. Foot pumps applied bilaterally at 80 mm. Heels elevated on bed with rolled towels. No evidence of DVT. Negative [place]. Physical therapy started on day #1 for gait training and transfer. OT started day #1 for ADL and assisted devices.  On POD2 she was able to clear stairs with PT.  Patient's IV and foley were both removed on POD1.  Implants: Right TKA using all-cemented [employer] Vanguard system with a 75 mm PCR femur, a 79 mm tibial tray with a 12 mm AS E-poly insert, and a 34 x 8.5 mm all-poly 3-pegged domed  patella.  She was given perioperative antibiotics:  Anti-infectives (From admission, onward)   Start     Dose/Rate Route Frequency Ordered Stop   03/09/18 1400  ceFAZolin (ANCEF) IVPB 2g/100 mL premix     2 g 200 mL/hr over 30 Minutes Intravenous Every 6 hours 03/09/18 1150 03/10/18 0251   03/09/18 0614  ceFAZolin (ANCEF) 2-4 GM/100ML-% IVPB    Note to Pharmacy:  Josephina Shih   : cabinet override      03/09/18 0614 03/09/18 0756   03/08/18 2200  ceFAZolin (ANCEF) IVPB 2g/100 mL premix     2 g 200 mL/hr over 30 Minutes Intravenous  Once 03/08/18 2146 03/09/18 0811    .  She was given sequential compression devices, early ambulation, and Lovenox for DVT prophylaxis.  She benefited maximally from the hospital stay and there were no complications.    Recent vital signs:  Vitals:   03/10/18 2331 03/11/18 0755  BP: 121/76 123/73  Pulse: 87 72  Resp: 18 18  Temp: 99.1 F (37.3 C) 97.8 F (36.6 C)  SpO2: 97% 97%    Recent laboratory studies:  Lab Results  Component Value Date   HGB 11.2 (L) 03/11/2018   HGB 11.3 (L) 03/10/2018   HGB 14.1 02/24/2018   Lab Results  Component Value Date   WBC 12.4 (H) 03/11/2018   PLT 163 03/11/2018   No results found for: INR Lab Results  Component Value Date   NA 137 03/11/2018   K 3.7 03/11/2018   CL 105 03/11/2018  CO2 27 03/11/2018   BUN 12 03/11/2018   CREATININE 0.69 03/11/2018   GLUCOSE 124 (H) 03/11/2018    Discharge Medications:   Allergies as of 03/11/2018      Reactions   Sulfa Antibiotics Nausea Only      Medication List    TAKE these medications   enoxaparin 40 MG/0.4ML injection Commonly known as:  LOVENOX Inject 0.4 mLs (40 mg total) into the skin daily. Start taking on:  03/12/2018   ibuprofen 200 MG tablet Commonly known as:  ADVIL,MOTRIN Take 400 mg by mouth every 6 (six) hours as needed for headache or moderate pain.   omeprazole 20 MG tablet Commonly known as:  PRILOSEC OTC Take 1 tablet (20 mg  total) by mouth daily as needed (for acid reflux).   oxyCODONE 5 MG immediate release tablet Commonly known as:  Oxy IR/ROXICODONE Take 1-2 tablets (5-10 mg total) by mouth every 4 (four) hours as needed for moderate pain.   traMADol 50 MG tablet Commonly known as:  ULTRAM Take 1-2 tablets (50-100 mg total) by mouth every 6 (six) hours as needed for moderate pain.            Durable Medical Equipment  (From admission, onward)         Start     Ordered   03/09/18 1151  DME Bedside commode  Once    Question:  Patient needs a bedside commode to treat with the following condition  Answer:  Status post total knee replacement using cement, right   03/09/18 1150   03/09/18 1151  DME 3 n 1  Once     03/09/18 1150   03/09/18 1151  DME Walker rolling  Once    Question:  Patient needs a walker to treat with the following condition  Answer:  Status post total knee replacement using cement, right   03/09/18 1150          Diagnostic Studies: Dg Chest 2 View  Result Date: 02/25/2018 CLINICAL DATA:  Preoperative exam for knee surgery in November. EXAM: CHEST - 2 VIEW COMPARISON:  None. FINDINGS: Heart size is normal. Mediastinal shadows are normal. There is mild pulmonary scarring. No evidence of infiltrate, collapse or effusion. Mild curvature of the spine. No acute bone finding. IMPRESSION: Mild chronic appearing pulmonary scarring. No active process suspected. Electronically Signed   By: Nelson Chimes M.D.   On: 02/25/2018 09:03   Dg Knee Right Port  Result Date: 03/09/2018 CLINICAL DATA:  Status post right total knee arthroplasty EXAM: PORTABLE RIGHT KNEE - 1-2 VIEW COMPARISON:  None. FINDINGS: The tibial and femoral components are well seated. No complicating features are demonstrated. IMPRESSION: Well seated components of a total knee arthroplasty. No complicating features. Electronically Signed   By: Marijo Sanes M.D.   On: 03/09/2018 11:10   Disposition: Plan for discharge home  with HHPT today pending results of UA.  Follow-up Information    Lattie Corns, PA-C Follow up in 14 day(s).   Specialty:  Physician Assistant Why:  Electa Sniff information: [place] [address] [phone]          Signed: Judson Roch PA-C 03/11/2018, 11:14 AM

## 2018-03-11 NOTE — Progress Notes (Signed)
Discharge instructions and prescriptions given to pt. IV removed. Pt getting dressed and will discharge home with husband.

## 2018-03-11 NOTE — Progress Notes (Signed)
  Subjective: 2 Days Post-Op Procedure(s) (LRB): TOTAL KNEE ARTHROPLASTY (Right) Patient reports pain as moderate.   Patient is well, and has had no acute complaints or problems Plan is for discharge home with HHPT. Negative for chest pain and shortness of breath Fever: 99.1 last night, no fevers recently. Gastrointestinal:Negative for nausea and vomiting  Objective: Vital signs in last 24 hours: Temp:  [97.8 F (36.6 C)-99.1 F (37.3 C)] 97.8 F (36.6 C) (11/07 0755) Pulse Rate:  [72-87] 72 (11/07 0755) Resp:  [18] 18 (11/07 0755) BP: (111-142)/(73-85) 123/73 (11/07 0755) SpO2:  [97 %-100 %] 97 % (11/07 0755)  Intake/Output from previous day: No intake or output data in the 24 hours ending 03/11/18 1111  Intake/Output this shift: No intake/output data recorded.  Labs: Recent Labs    03/10/18 0339 03/11/18 0409  HGB 11.3* 11.2*   Recent Labs    03/10/18 0339 03/11/18 0409  WBC 9.5 12.4*  RBC 3.95 3.90  HCT 35.3* 34.0*  PLT 157 163   Recent Labs    03/10/18 0339 03/11/18 0409  NA 139 137  K 3.6 3.7  CL 107 105  CO2 27 27  BUN 17 12  CREATININE 0.66 0.69  GLUCOSE 122* 124*  CALCIUM 8.4* 8.4*   No results for input(s): LABPT, INR in the last 72 hours.   EXAM General - Patient is Alert, Appropriate and Oriented Extremity - ABD soft Sensation intact distally Intact pulses distally Dorsiflexion/Plantar flexion intact Incision: dressing C/D/I No cellulitis present Dressing/Incision - clean, dry, no drainage Motor Function - intact, moving foot and toes well on exam.  Abdomen soft with normal BS.  No tympany.  Past Medical History:  Diagnosis Date  . Chronic back pain   . Colon polyps   . GERD (gastroesophageal reflux disease)   . Heart murmur   . Osteoarthritis     Assessment/Plan: 2 Days Post-Op Procedure(s) (LRB): TOTAL KNEE ARTHROPLASTY (Right) Active Problems:   Status post total knee replacement using cement, right   Status post total  knee replacement using cement  Estimated body mass index is 33.77 kg/m as calculated from the following:   Height as of this encounter: 5\' 9"  (1.753 m).   Weight as of this encounter: 103.7 kg. Advance diet Up with therapy   Labs reviewed this AM, Hg 11.2. WBC increased to 12.4 and mild fevers last night, will check UA prior to discharge. Patient has cleared all steps with PT. Patient has had a BM. Plan will be for discharge home later this afternoon.  DVT Prophylaxis - Lovenox, Foot Pumps and TED hose Weight-Bearing as tolerated to right leg  J. Cameron Proud, PA-C [facility] 03/11/2018, 11:11 AM

## 2018-03-11 NOTE — Care Management Note (Signed)
Case Management Note  Patient Details  Name: SHAKARI QAZI MRN: [id number] Date of Birth: 30-Mar-1951  Subjective/Objective:  Spoke with patient regarding discharge planning.  She lives at home with her spouse. [facility]. Referral to [facility] for [facility]PT.  Walker ordered from [place] with [facility].                Action/Plan:   Expected Discharge Date:  03/11/18               Expected Discharge Plan:  [place]  In-House Referral:     Discharge planning Services  CM Consult  Post Acute Care Choice:  Durable Medical Equipment, Home Health Choice offered to:  Patient  DME Arranged:  Walker rolling DME Agency:  [place]:  PT [place] Agency:  [facility]  Status of Service:  Completed, signed off  If discussed at [employer] of Stay Meetings, dates discussed:    Additional Comments:  Jolly Mango, RN 03/11/2018, 2:49 PM

## 2018-03-11 NOTE — Discharge Instructions (Signed)

## 2018-03-12 ENCOUNTER — Encounter: Payer: Self-pay | Admitting: Family Medicine

## 2018-04-16 ENCOUNTER — Encounter: Payer: Self-pay | Admitting: Family Medicine

## 2018-04-16 ENCOUNTER — Ambulatory Visit (INDEPENDENT_AMBULATORY_CARE_PROVIDER_SITE_OTHER): Payer: Medicare Other | Admitting: Family Medicine

## 2018-04-16 ENCOUNTER — Other Ambulatory Visit (HOSPITAL_COMMUNITY)
Admission: RE | Admit: 2018-04-16 | Discharge: 2018-04-16 | Disposition: A | Discharge status: Home / Self Care | Payer: Medicare Other | Source: Ambulatory Visit | Attending: Family Medicine | Admitting: Family Medicine

## 2018-04-16 VITALS — BP 103/73 | HR 68 | Temp 97.8°F | Ht 69.0 in | Wt 226.0 lb

## 2018-04-16 DIAGNOSIS — Z6833 Body mass index (BMI) 33.0-33.9, adult: Secondary | ICD-10-CM

## 2018-04-16 DIAGNOSIS — Z0000 Encounter for general adult medical examination without abnormal findings: Secondary | ICD-10-CM | POA: Diagnosis not present

## 2018-04-16 DIAGNOSIS — E669 Obesity, unspecified: Secondary | ICD-10-CM

## 2018-04-16 DIAGNOSIS — Z1239 Encounter for other screening for malignant neoplasm of breast: Secondary | ICD-10-CM | POA: Diagnosis not present

## 2018-04-16 DIAGNOSIS — Z780 Asymptomatic menopausal state: Secondary | ICD-10-CM

## 2018-04-16 DIAGNOSIS — E66811 Obesity, class 1: Secondary | ICD-10-CM

## 2018-04-16 DIAGNOSIS — Z124 Encounter for screening for malignant neoplasm of cervix: Secondary | ICD-10-CM | POA: Diagnosis not present

## 2018-04-16 NOTE — Progress Notes (Signed)
Patient: Kathleen Hart, Female    DOB: January 14, 1951, 67 y.o.   MRN: [id number] Visit Date: 04/16/2018  Today's Provider: Lavon Paganini, MD   Chief Complaint  Patient presents with  . Medicare Wellness   Subjective:  I, Tiburcio Pea, CMA, am acting as a scribe for Lavon Paganini, MD.   Initial preventative physical exam Kathleen Hart is a 67 y.o. female who presents today for her Initial Preventative Physical Exam. She feels fairly well. She reports exercising no regular exercise, just doing PT for knee surgery . She reports she is sleeping poorly, several nighttime awakenings.   Review of Systems  Constitutional: Positive for activity change and fatigue.  HENT: Negative.   Eyes: Negative.   Respiratory: Negative.   Cardiovascular: Negative.   Gastrointestinal: Positive for constipation.  Endocrine: Negative.   Genitourinary: Negative.   Musculoskeletal: Negative.   Skin: Negative.   Allergic/Immunologic: Negative.   Hematological: Negative.   Psychiatric/Behavioral: Negative.     Social History   Socioeconomic History  . Marital status: Married    Spouse name: Not on file  . Number of children: 2  . Years of education: Not on file  . Highest education level: Not on file  Occupational History  . 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 Financial resource strain: Not on file  . Food insecurity:    Worry: Not on file    Inability: Not on file  . Transportation needs:    Medical: Not on file    Non-medical: Not on file  Tobacco Use  . Smoking status: Never Smoker  . Smokeless tobacco: Never Used  Substance and Sexual Activity  . Alcohol use: Yes    Comment: occasional, social  . Drug use: No  . Sexual activity: Not Currently  Lifestyle  . Physical activity:    Days per week: Not on file    Minutes per session: Not on file  . Stress: Not on file  Relationships  . Social connections:    Talks on phone: Not on  file    Gets together: Not on file    Attends religious service: Not on file    Active member of club or organization: Not on file    Attends meetings of clubs or organizations: Not on file    Relationship status: Not on file  . Intimate partner violence:    Fear of current or ex partner: Not on file    Emotionally abused: Not on file    Physically abused: Not on file    Forced sexual activity: Not on file  Other Topics Concern  . Not on file  Social History Narrative  . Not on file    Past Medical History:  Diagnosis Date  . Chronic back pain   . Colon polyps   . GERD (gastroesophageal reflux disease)   . Heart murmur   . Osteoarthritis      Patient Active Problem List   Diagnosis Date Noted  . Status post total knee replacement using cement 03/10/2018  . Status post total knee replacement using cement, right 03/09/2018  . Class 1 obesity without serious comorbidity with body mass index (BMI) of 33.0 to 33.9 in adult 03/03/2018  . Chronic fatigue 03/03/2018  . Osteoarthritis 10/18/2015  . Chronic pain 10/18/2015  . Gastroesophageal reflux disease 06/04/2015  . Colon polyps 06/04/2015    Past Surgical History:  Procedure Laterality Date  . ABDOMINOPLASTY    .  AUGMENTATION MAMMAPLASTY    . BREAST BIOPSY    . COLONOSCOPY    . COLONOSCOPY WITH PROPOFOL N/A 07/23/2015   Procedure: COLONOSCOPY WITH PROPOFOL;  Surgeon: Lollie Sails, MD;  [facility] END[facility]COPY;  Service: Endoscopy;  Laterality: N/A;  . DILATION AND CURETTAGE OF UTERUS    . DILATION AND CURETTAGE, DIAGNOSTIC / THERAPEUTIC    . TOTAL KNEE ARTHROPLASTY Right 03/09/2018   Procedure: TOTAL KNEE ARTHROPLASTY;  Surgeon: Corky Mull, MD;  Location: [facility] ORS;  Service: Orthopedics;  Laterality: Right;    Her family history includes Arthritis in her mother and sister; Bladder Cancer in her maternal uncle; COPD in her mother; Cancer in her mother and sister; Cancer (age of onset: 40) in her father; Kidney  disease in her mother.      Current Outpatient Medications:  .  ibuprofen (ADVIL,MOTRIN) 200 MG tablet, Take 400 mg by mouth every 6 (six) hours as needed for headache or moderate pain., Disp: , Rfl:  .  omeprazole (PRILOSEC OTC) 20 MG tablet, Take 1 tablet (20 mg total) by mouth daily as needed (for acid reflux)., Disp: 90 tablet, Rfl: 3   Patient Care Team: Jadon Crews, MD as PCP - General (Family Medicine)      Objective:   Vitals: BP 103/73 (BP Location: Right Arm, Patient Position: Sitting, Cuff Size: Normal)   Pulse 68   Temp 97.8 F (36.6 C) (Oral)   Ht 5\' 9"  (1.753 m)   Wt 226 lb (102.5 kg)   SpO2 99%   BMI 33.37 kg/m   Physical Exam Vitals signs reviewed.  Constitutional:      General: She is not in acute distress.    Appearance: Normal appearance. She is well-developed. She is not diaphoretic.  HENT:     Head: Normocephalic and atraumatic.     Right Ear: Tympanic membrane, ear canal and external ear normal.     Left Ear: Tympanic membrane, ear canal and external ear normal.     Nose: Nose normal.     Mouth/Throat:     Mouth: Mucous membranes are moist.     Pharynx: Oropharynx is clear. No oropharyngeal exudate.  Eyes:     General: No scleral icterus.    Conjunctiva/sclera: Conjunctivae normal.     Pupils: Pupils are equal, round, and reactive to light.  Neck:     Musculoskeletal: Neck supple.     Thyroid: No thyromegaly.  Cardiovascular:     Rate and Rhythm: Normal rate and regular rhythm.     Pulses: Normal pulses.     Heart sounds: Normal heart sounds. No murmur.  Pulmonary:     Effort: Pulmonary effort is normal. No respiratory distress.     Breath sounds: Normal breath sounds. No wheezing or rales.  Abdominal:     General: Bowel sounds are normal. There is no distension.     Palpations: Abdomen is soft.     Tenderness: There is no abdominal tenderness. There is no guarding or rebound.  Genitourinary:    Comments: GYN:  External genitalia  within normal limits.  Vaginal mucosa pink, moist, normal rugae.  Nonfriable cervix without lesions, no discharge or bleeding noted on speculum exam.   Musculoskeletal:        General: No deformity.     Right lower leg: No edema.     Left lower leg: No edema.  Lymphadenopathy:     Cervical: No cervical adenopathy.  Skin:    General: Skin is warm  and dry.     Capillary Refill: Capillary refill takes less than 2 seconds.     Findings: No rash.  Neurological:     General: No focal deficit present.     Mental Status: She is alert and oriented to person, place, and time.  Psychiatric:        Mood and Affect: Mood normal.        Behavior: Behavior normal.        Thought Content: Thought content normal.       Hearing Screening   125Hz  250Hz  500Hz  1000Hz  2000Hz  3000Hz  4000Hz  6000Hz  8000Hz   Right ear:   25 25 25  25     Left ear:   25 25 25  25       Activities of Daily Living In your present state of health, do you have any difficulty performing the following activities: 04/16/2018 03/09/2018  Hearing? N N  Vision? Y N  Comment - -  Difficulty concentrating or making decisions? N N  Walking or climbing stairs? Y Y  Comment - -  Dressing or bathing? N Y  Doing errands, shopping? N N  Some recent data might be hidden    Fall Risk Assessment Fall Risk  04/16/2018 03/03/2018 03/03/2018 01/28/2017  Falls in the past year? 0 No No No     Depression Screen PHQ 2/9 Scores 04/16/2018 03/03/2018 01/28/2017  PHQ - 2 Score 2 4 0  PHQ- 9 Score 7 15 -    6CIT Screen 04/16/2018  What Year? 0 points  What month? 0 points  What time? 0 points  Count back from 20 0 points  Months in reverse 0 points  Repeat phrase 6 points  Total Score 6     Assessment & Plan:     Initial Preventative Physical Exam  Reviewed patient's Family Medical History Reviewed and updated list of patient's medical providers Assessment of cognitive impairment was done Assessed patient's functional  ability Established a written schedule for health screening [place] Completed and Reviewed  Exercise Activities and Dietary recommendations Goals   None     Immunization History  Administered Date(s) Administered  . Pneumococcal Conjugate-13 03/03/2018  . Tdap 05/02/2015    Health Maintenance  Topic Date Due  . PAP SMEAR-Modifier  05/10/1973  . DEXA SCAN  05/10/2017  . INFLUENZA VACCINE  01/02/2019 (Originally 12/03/2017)  . PNA vac Low Risk Adult (2 of 2 - PPSV23) 03/04/2019  . MAMMOGRAM  07/21/2019  . COLONOSCOPY  07/22/2020  . TETANUS/TDAP  05/01/2025  . Hepatitis C Screening  Completed  . HIV Screening  Completed     Discussed health benefits of physical activity, and encouraged her to engage in regular exercise appropriate for her age and condition.    ------------------------------------------------------------------------------------------------------------  Problem List Items Addressed This Visit      Other   Class 1 obesity without serious comorbidity with body mass index (BMI) of 33.0 to 33.9 in adult    Other Visit Diagnoses    Welcome to Medicare preventive visit    -  Primary   Relevant Orders   EKG 12-Lead (Completed)   Screening for cervical cancer       Relevant Orders   Cytology - PAP   Screening for breast cancer       Relevant Orders   MM 3D SCREEN BREAST BILATERAL   Postmenopausal       Relevant Orders   DG Bone Density       Return  in about 1 year (around 04/17/2019) for AWV/CPE.   The entirety of the information documented in the History of Present Illness, Review of Systems and Physical Exam were personally obtained by me. Portions of this information were initially documented by Tiburcio Pea, CMA and reviewed by me for thoroughness and accuracy.    Raisha Crews, MD, MPH [facility] 04/16/2018 1:29 PM

## 2018-04-16 NOTE — Patient Instructions (Signed)
Preventive Care 65 Years and Older, Female Preventive care refers to lifestyle choices and visits with your health care provider that can promote health and wellness. What does preventive care include?  A yearly physical exam. This is also called an annual well check.  Dental exams once or twice a year.  Routine eye exams. Ask your health care provider how often you should have your eyes checked.  Personal lifestyle choices, including: ? Daily care of your teeth and gums. ? Regular physical activity. ? Eating a healthy diet. ? Avoiding tobacco and drug use. ? Limiting alcohol use. ? Practicing safe sex. ? Taking low-dose aspirin every day. ? Taking vitamin and mineral supplements as recommended by your health care provider. What happens during an annual well check? The services and screenings done by your health care provider during your annual well check will depend on your age, overall health, lifestyle risk factors, and family history of disease. Counseling Your health care provider may ask you questions about your:  Alcohol use.  Tobacco use.  Drug use.  Emotional well-being.  Home and relationship well-being.  Sexual activity.  Eating habits.  History of falls.  Memory and ability to understand (cognition).  Work and work environment.  Reproductive health.  Screening You may have the following tests or measurements:  Height, weight, and BMI.  Blood pressure.  Lipid and cholesterol levels. These may be checked every 5 years, or more frequently if you are over 50 years old.  Skin check.  Lung cancer screening. You may have this screening every year starting at age 55 if you have a 30-pack-year history of smoking and currently smoke or have quit within the past 15 years.  Fecal occult blood test (FOBT) of the stool. You may have this test every year starting at age 50.  Flexible sigmoidoscopy or colonoscopy. You may have a sigmoidoscopy every 5 years or  a colonoscopy every 10 years starting at age 50.  Hepatitis C blood test.  Hepatitis B blood test.  Sexually transmitted disease (STD) testing.  Diabetes screening. This is done by checking your blood sugar (glucose) after you have not eaten for a while (fasting). You may have this done every 1-3 years.  Bone density scan. This is done to screen for osteoporosis. You may have this done starting at age 65.  Mammogram. This may be done every 1-2 years. Talk to your health care provider about how often you should have regular mammograms.  Talk with your health care provider about your test results, treatment options, and if necessary, the need for more tests. Vaccines Your health care provider may recommend certain vaccines, such as:  Influenza vaccine. This is recommended every year.  Tetanus, diphtheria, and acellular pertussis (Tdap, Td) vaccine. You may need a Td booster every 10 years.  Varicella vaccine. You may need this if you have not been vaccinated.  Zoster vaccine. You may need this after age 60.  Measles, mumps, and rubella (MMR) vaccine. You may need at least one dose of MMR if you were born in 1957 or later. You may also need a second dose.  Pneumococcal 13-valent conjugate (PCV13) vaccine. One dose is recommended after age 65.  Pneumococcal polysaccharide (PPSV23) vaccine. One dose is recommended after age 65.  Meningococcal vaccine. You may need this if you have certain conditions.  Hepatitis A vaccine. You may need this if you have certain conditions or if you travel or work in places where you may be exposed to hepatitis   A.  Hepatitis B vaccine. You may need this if you have certain conditions or if you travel or work in places where you may be exposed to hepatitis B.  Haemophilus influenzae type b (Hib) vaccine. You may need this if you have certain conditions.  Talk to your health care provider about which screenings and vaccines you need and how often you  need them. This information is not intended to replace advice given to you by your health care provider. Make sure you discuss any questions you have with your health care provider. Document Released: 05/18/2015 Document Revised: 01/09/2016 Document Reviewed: 02/20/2015 Elsevier Interactive Patient Education  2018 [employer]vier Inc.  

## 2018-04-21 LAB — CYTOLOGY - PAP
Diagnosis: NEGATIVE
HPV: NOT DETECTED

## 2018-04-22 ENCOUNTER — Telehealth: Payer: Self-pay | Admitting: Family Medicine

## 2018-04-22 NOTE — Telephone Encounter (Signed)
Bone density scheduled at [facility] 07/22/18 at 12:15.Phone [phone]

## 2018-06-22 ENCOUNTER — Ambulatory Visit (INDEPENDENT_AMBULATORY_CARE_PROVIDER_SITE_OTHER): Payer: Medicare Other | Admitting: Family Medicine

## 2018-06-22 ENCOUNTER — Encounter: Payer: Self-pay | Admitting: Family Medicine

## 2018-06-22 VITALS — BP 106/71 | HR 60 | Temp 98.0°F | Wt 220.8 lb

## 2018-06-22 DIAGNOSIS — J0140 Acute pansinusitis, unspecified: Secondary | ICD-10-CM | POA: Diagnosis not present

## 2018-06-22 DIAGNOSIS — R059 Cough, unspecified: Secondary | ICD-10-CM

## 2018-06-22 DIAGNOSIS — R05 Cough: Secondary | ICD-10-CM | POA: Diagnosis not present

## 2018-06-22 MED ORDER — FLUTICASONE PROPIONATE 50 MCG/ACT NA SUSP: 2.0000 | Freq: Every day | NASAL | 6 refills | Status: DC

## 2018-06-22 MED ORDER — AMOXICILLIN-POT CLAVULANATE 875-125 MG PO TABS: 1.0000 | ORAL_TABLET | Freq: Two times a day (BID) | ORAL | 0 refills | Status: AC

## 2018-06-22 NOTE — Progress Notes (Signed)
Patient: Kathleen Hart Female    DOB: 05/23/1950   68 y.o.   MRN: [id number] Visit Date: 06/22/2018  Today's Provider: Lavon Paganini, MD   Chief Complaint  Patient presents with  . URI    cough   Subjective:     URI   This is a new problem. The current episode started 1 to 4 weeks ago (cough started 2 wks). The problem has been gradually worsening. There has been no fever. Associated symptoms include congestion, coughing, headaches, sinus pain and wheezing. Pertinent negatives include no abdominal pain, chest pain, diarrhea, dysuria, ear pain, joint pain, joint swelling, nausea, neck pain, plugged ear sensation, rash, rhinorrhea, sneezing, sore throat, swollen glands or vomiting. She has tried acetaminophen and antihistamine for the symptoms. The treatment provided mild relief.   Felt like she had a cold 4 weeks ago then settled into sinuses Still with sinus pressure and headaches Cough is the worst now   Allergies  Allergen Reactions  . Sulfa Antibiotics Nausea Only     Current Outpatient Medications:  .  ibuprofen (ADVIL,MOTRIN) 200 MG tablet, Take 400 mg by mouth every 6 (six) hours as needed for headache or moderate pain., Disp: , Rfl:  .  Multiple Vitamin (MULTIVITAMIN) tablet, Take 1 tablet by mouth daily., Disp: , Rfl:  .  omeprazole (PRILOSEC OTC) 20 MG tablet, Take 1 tablet (20 mg total) by mouth daily as needed (for acid reflux)., Disp: 90 tablet, Rfl: 3  Review of Systems  Constitutional: Negative.   HENT: Positive for congestion, sinus pressure and sinus pain. Negative for dental problem, drooling, ear discharge, ear pain, facial swelling, hearing loss, mouth sores, nosebleeds, postnasal drip, rhinorrhea, sneezing, sore throat, tinnitus, trouble swallowing and voice change.   Respiratory: Positive for cough and wheezing.   Cardiovascular: Negative.  Negative for chest pain.  Gastrointestinal: Negative for abdominal pain, diarrhea, nausea and vomiting.   Genitourinary: Negative.  Negative for dysuria.  Musculoskeletal: Negative.  Negative for joint pain and neck pain.  Skin: Negative for rash.  Neurological: Positive for headaches. Negative for dizziness, tremors, seizures, syncope, facial asymmetry, speech difficulty, weakness, light-headedness and numbness.    Social History   Tobacco Use  . Smoking status: Never Smoker  . Smokeless tobacco: Never Used  Substance Use Topics  . Alcohol use: Yes    Comment: occasional, social      Objective:   BP 106/71 (BP Location: Left Arm, Patient Position: Sitting, Cuff Size: Large)   Pulse 60   Temp 98 F (36.7 C) (Oral)   Wt 220 lb 12.8 oz (100.2 kg)   SpO2 96%   BMI 32.61 kg/m  Vitals:   06/22/18 0944  BP: 106/71  Pulse: 60  Temp: 98 F (36.7 C)  TempSrc: Oral  SpO2: 96%  Weight: 220 lb 12.8 oz (100.2 kg)     Physical Exam Vitals signs reviewed.  Constitutional:      General: She is not in acute distress.    Appearance: Normal appearance. She is not diaphoretic.  HENT:     Head: Normocephalic and atraumatic.     Right Ear: Tympanic membrane, ear canal and external ear normal.     Left Ear: Tympanic membrane, ear canal and external ear normal.     Nose: Congestion and rhinorrhea present.     Right Turbinates: Not enlarged.     Left Turbinates: Not enlarged.     Right Sinus: Maxillary sinus tenderness and frontal sinus tenderness  present.     Left Sinus: Maxillary sinus tenderness and frontal sinus tenderness present.     Mouth/Throat:     Mouth: Mucous membranes are moist.     Pharynx: Oropharynx is clear. No oropharyngeal exudate or posterior oropharyngeal erythema.  Eyes:     General: No scleral icterus.       Right eye: No discharge.        Left eye: No discharge.     Conjunctiva/sclera: Conjunctivae normal.     Pupils: Pupils are equal, round, and reactive to light.  Neck:     Musculoskeletal: Neck supple.  Cardiovascular:     Rate and Rhythm: Normal rate  and regular rhythm.     Pulses: Normal pulses.     Heart sounds: Normal heart sounds.  Pulmonary:     Effort: Pulmonary effort is normal. No respiratory distress.     Breath sounds: Normal breath sounds. No wheezing or rhonchi.  Abdominal:     General: There is no distension.     Palpations: Abdomen is soft.     Tenderness: There is no abdominal tenderness.  Musculoskeletal:     Right lower leg: No edema.     Left lower leg: No edema.  Lymphadenopathy:     Cervical: No cervical adenopathy.  Skin:    General: Skin is warm and dry.     Capillary Refill: Capillary refill takes less than 2 seconds.     Findings: No rash.  Neurological:     Mental Status: She is alert and oriented to person, place, and time. Mental status is at baseline.  Psychiatric:        Mood and Affect: Mood normal.        Behavior: Behavior normal.        Assessment & Plan   1. Acute non-recurrent pansinusitis - symptoms and exam c/w sinusitis   - no evidence of AOM, CAP, strep pharyngitis, or other infection - given duration of symptoms, suspect bacterial etiology - will treat with Augmentin x10d - discussed symptomatic management (flonase, decongestants, etc), natural course, and return precautions    2. Cough - likely 2/2 postnasal drip related to sinusitis - lungs clear on exam - can use OTC cough suppressants    Meds ordered this encounter  Medications  . amoxicillin-clavulanate (AUGMENTIN) 875-125 MG tablet    Sig: Take 1 tablet by mouth 2 (two) times daily for 10 days.    Dispense:  20 tablet    Refill:  0  . fluticasone (FLONASE) 50 MCG/ACT nasal spray    Sig: Place 2 sprays into both nostrils daily.    Dispense:  16 g    Refill:  6     Return if symptoms worsen or fail to improve.   The entirety of the information documented in the History of Present Illness, Review of Systems and Physical Exam were personally obtained by me. Portions of this information were initially documented  by Tiburcio Pea, CMA and Kellie Shropshire and reviewed by me for thoroughness and accuracy.    Alana Crews, MD, MPH [facility] 06/22/2018 10:02 AM

## 2018-06-22 NOTE — Patient Instructions (Signed)
Sinusitis, Adult  Sinusitis is inflammation of your sinuses. Sinuses are hollow spaces in the bones around your face. Your sinuses are located:   Around your eyes.   In the middle of your forehead.   Behind your nose.   In your cheekbones.  Mucus normally drains out of your sinuses. When your nasal tissues become inflamed or swollen, mucus can become trapped or blocked. This allows bacteria, viruses, and fungi to grow, which leads to infection. Most infections of the sinuses are caused by a virus.  Sinusitis can develop quickly. It can last for up to 4 weeks (acute) or for more than 12 weeks (chronic). Sinusitis often develops after a cold.  What are the causes?  This condition is caused by anything that creates swelling in the sinuses or stops mucus from draining. This includes:   Allergies.   Asthma.   Infection from bacteria or viruses.   Deformities or blockages in your nose or sinuses.   Abnormal growths in the nose (nasal polyps).   Pollutants, such as chemicals or irritants in the air.   Infection from fungi (rare).  What increases the risk?  You are more likely to develop this condition if you:   Have a weak body defense system (immune system).   Do a lot of swimming or diving.   Overuse nasal sprays.   Smoke.  What are the signs or symptoms?  The main symptoms of this condition are pain and a feeling of pressure around the affected sinuses. Other symptoms include:   Stuffy nose or congestion.   Thick drainage from your nose.   Swelling and warmth over the affected sinuses.   Headache.   Upper toothache.   A cough that may get worse at night.   Extra mucus that collects in the throat or the back of the nose (postnasal drip).   Decreased sense of smell and taste.   Fatigue.   A fever.   Sore throat.   Bad breath.  How is this diagnosed?  This condition is diagnosed based on:   Your symptoms.   Your medical history.   A physical exam.   Tests to find out if your condition is  acute or chronic. This may include:  ? Checking your nose for nasal polyps.  ? Viewing your sinuses using a device that has a light (endoscope).  ? Testing for allergies or bacteria.  ? Imaging tests, such as an MRI or CT scan.  In rare cases, a bone biopsy may be done to rule out more serious types of fungal sinus disease.  How is this treated?  Treatment for sinusitis depends on the cause and whether your condition is chronic or acute.   If caused by a virus, your symptoms should go away on their own within 10 days. You may be given medicines to relieve symptoms. They include:  ? Medicines that shrink swollen nasal passages (topical intranasal decongestants).  ? Medicines that treat allergies (antihistamines).  ? A spray that eases inflammation of the nostrils (topical intranasal corticosteroids).  ? Rinses that help get rid of thick mucus in your nose (nasal saline washes).   If caused by bacteria, your health care provider may recommend waiting to see if your symptoms improve. Most bacterial infections will get better without antibiotic medicine. You may be given antibiotics if you have:  ? A severe infection.  ? A weak immune system.   If caused by narrow nasal passages or nasal polyps, you may need   to have surgery.  Follow these instructions at home:  Medicines   Take, use, or apply over-the-counter and prescription medicines only as told by your health care provider. These may include nasal sprays.   If you were prescribed an antibiotic medicine, take it as told by your health care provider. Do not stop taking the antibiotic even if you start to feel better.  Hydrate and humidify     Drink enough fluid to keep your urine pale yellow. Staying hydrated will help to thin your mucus.   Use a cool mist humidifier to keep the humidity level in your home above 50%.   Inhale steam for 10-15 minutes, 3-4 times a day, or as told by your health care provider. You can do this in the bathroom while a hot shower is  running.   Limit your exposure to cool or dry air.  Rest   Rest as much as possible.   Sleep with your head raised (elevated).   Make sure you get enough sleep each night.  General instructions     Apply a warm, moist washcloth to your face 3-4 times a day or as told by your health care provider. This will help with discomfort.   Wash your hands often with soap and water to reduce your exposure to germs. If soap and water are not available, use hand sanitizer.   Do not smoke. Avoid being around people who are smoking (secondhand smoke).   Keep all follow-up visits as told by your health care provider. This is important.  Contact a health care provider if:   You have a fever.   Your symptoms get worse.   Your symptoms do not improve within 10 days.  Get help right away if:   You have a severe headache.   You have persistent vomiting.   You have severe pain or swelling around your face or eyes.   You have vision problems.   You develop confusion.   Your neck is stiff.   You have trouble breathing.  Summary   Sinusitis is soreness and inflammation of your sinuses. Sinuses are hollow spaces in the bones around your face.   This condition is caused by nasal tissues that become inflamed or swollen. The swelling traps or blocks the flow of mucus. This allows bacteria, viruses, and fungi to grow, which leads to infection.   If you were prescribed an antibiotic medicine, take it as told by your health care provider. Do not stop taking the antibiotic even if you start to feel better.   Keep all follow-up visits as told by your health care provider. This is important.  This information is not intended to replace advice given to you by your health care provider. Make sure you discuss any questions you have with your health care provider.  Document Released: 04/21/2005 Document Revised: 09/21/2017 Document Reviewed: 09/21/2017  Elsevier Interactive Patient Education  2019 [employer]vier Inc[employer]

## 2018-07-22 LAB — HM MAMMOGRAPHY

## 2018-07-22 LAB — HM DEXA SCAN

## 2018-11-18 ENCOUNTER — Other Ambulatory Visit: Payer: Self-pay | Admitting: Family Medicine

## 2019-04-18 NOTE — Progress Notes (Signed)
Subjective:   Kathleen Hart is a 68 y.o. female who presents for Medicare Annual (Subsequent) preventive examination.    This visit is being conducted through telemedicine due to the COVID-19 pandemic. This patient has given me verbal consent via doximity to conduct this visit, patient states they are participating from their home address. Some vital signs may be absent or patient reported.    Patient identification: identified by name, DOB, and current address  Review of Systems:  N/A  Cardiac Risk Factors include: advanced age (>30men, >5 women)     Objective:     Vitals: There were no vitals taken for this visit.  There is no height or weight on file to calculate BMI. Unable to obtain vitals due to visit being conducted via telephonically.   Advanced Directives 04/19/2019 03/09/2018 03/09/2018 02/24/2018  Does Patient Have a Medical Advance Directive? Yes Yes Yes Yes  Type of Paramedic of [place];Living will [place];Living will [place];Living will [place];Living will  Does patient want to make changes to medical advance directive? - No - Patient declined - -  Copy of [place] in Chart? Yes - validated most recent copy scanned in chart (See row information) Yes - validated most recent copy scanned in chart (See row information) Yes No - copy requested    Tobacco Social History   Tobacco Use  Smoking Status Never Smoker  Smokeless Tobacco Never Used     Counseling given: Not Answered   Clinical Intake:  Pre-visit preparation completed: Yes  Pain : No/denies pain Pain Score: 0-No pain     Nutritional Risks: None Diabetes: No  How often do you need to have someone help you when you read instructions, pamphlets, or other written materials from your doctor or pharmacy?: 1 - Never  Interpreter Needed?: No  Information entered by :: [facility],  LPN  Past Medical History:  Diagnosis Date  . Chronic back pain   . Colon polyps   . GERD (gastroesophageal reflux disease)   . Heart murmur   . Osteoarthritis    Past Surgical History:  Procedure Laterality Date  . ABDOMINOPLASTY    . AUGMENTATION MAMMAPLASTY    . BREAST BIOPSY    . COLONOSCOPY    . COLONOSCOPY WITH PROPOFOL N/A 07/23/2015   Procedure: COLONOSCOPY WITH PROPOFOL;  Surgeon: Lollie Sails, MD;  Location: [facility] ENDOSCOPY;  Service: Endoscopy;  Laterality: N/A;  . DILATION AND CURETTAGE OF UTERUS    . DILATION AND CURETTAGE, DIAGNOSTIC / THERAPEUTIC    . TOTAL KNEE ARTHROPLASTY Right 03/09/2018   Procedure: TOTAL KNEE ARTHROPLASTY;  Surgeon: Corky Mull, MD;  Location: [facility] ORS;  Service: Orthopedics;  Laterality: Right;   Family History  Problem Relation Age of Onset  . Arthritis Mother   . Kidney disease Mother   . Cancer Mother        bladder  . COPD Mother        smoker  . Cancer Father 36       prostate, low grade  . Cancer Sister        Cervical cancer  . Arthritis Sister   . Bladder Cancer Maternal Uncle    Social History   Socioeconomic History  . Marital status: Married    Spouse name: Not on file  . Number of children: 2  . Years of education: Not on file  . Highest education level: Not on file  Occupational History  . 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 Smoking status: Never Smoker  . Smokeless tobacco: Never Used  Substance and Sexual Activity  . Alcohol use: Yes    Comment: occasional, social  . Drug use: No  . Sexual activity: Not Currently  Other Topics Concern  . Not on file  Social History Narrative  . Not on file   Social Determinants of Health   Financial Resource Strain:   . Difficulty of Paying Living Expenses: Not on file  Food Insecurity:   . Worried About Charity fundraiser in the Last Year: Not on file  . Ran Out of Food in the Last Year: Not on file  Transportation Needs:   .  Lack of Transportation (Medical): Not on file  . Lack of Transportation (Non-Medical): Not on file  Physical Activity:   . Days of Exercise per Week: Not on file  . Minutes of Exercise per Session: Not on file  Stress:   . Feeling of Stress : Not on file  Social Connections:   . Frequency of Communication with Friends and Family: Not on file  . Frequency of Social Gatherings with Friends and Family: Not on file  . Attends Religious Services: Not on file  . Active Member of Clubs or Organizations: Not on file  . Attends Archivist Meetings: Not on file  . Marital Status: Not on file    Outpatient Encounter Medications as of 04/19/2019  Medication Sig  . CALCIUM-MAGNESIUM-ZINC PO Take by mouth daily.  . Cholecalciferol (VITAMIN D3) 50 MCG (2000 UT) capsule Take 4,000 Units by mouth daily.  . fluticasone (FLONASE) 50 MCG/ACT nasal spray SPRAY 2 SPRAYS INTO EACH NOSTRIL EVERY DAY  . ibuprofen (ADVIL,MOTRIN) 200 MG tablet Take 400 mg by mouth every 6 (six) hours as needed for headache or moderate pain.  . Multiple Vitamin (MULTIVITAMIN) tablet Take 1 tablet by mouth daily.  Marland Kitchen omeprazole (PRILOSEC OTC) 20 MG tablet Take 1 tablet (20 mg total) by mouth daily as needed (for acid reflux).   No facility-administered encounter medications on file as of 04/19/2019.    Activities of Daily Living In your present state of health, do you have any difficulty performing the following activities: 04/19/2019  Hearing? N  Vision? N  Difficulty concentrating or making decisions? N  Walking or climbing stairs? N  Dressing or bathing? N  Doing errands, shopping? N  Preparing Food and eating ? N  Using the Toilet? N  In the past six months, have you accidently leaked urine? N  Do you have problems with loss of bowel control? N  Managing your Medications? N  Managing your Finances? N  Housekeeping or managing your Housekeeping? N  Some recent data might be hidden    Patient Care  Team: Hazely Crews, MD as PCP - General (Family Medicine) Ralene Bathe, MD (Dermatology) Garth Bigness as Referring Physician (Chiropractic Medicine)    Assessment:   This is a routine wellness examination for [place].  Exercise Activities and Dietary recommendations Current Exercise Habits: Home exercise routine, Type of exercise: treadmill;walking, Time (Minutes): 30(to 45 minutes), Frequency (Times/Week): 3, Weekly Exercise (Minutes/Week): 90, Intensity: Mild, Exercise limited by: None identified  Goals   None     Fall Risk: Fall Risk  04/19/2019 04/16/2018 03/03/2018 03/03/2018 01/28/2017  Falls in the past year? 0 0 No No No  Number falls in past yr: 0 - - - -  Injury with Fall? 0 - - - -    FALL RISK PREVENTION PERTAINING TO THE HOME:  Any stairs in or around the home? Yes  If so, are there any without handrails? No   Home free of loose throw rugs in walkways, pet beds, electrical cords, etc? Yes  Adequate lighting in your home to reduce risk of falls? Yes   ASSISTIVE DEVICES UTILIZED TO PREVENT FALLS:  Life alert? No  Use of a cane, walker or w/c? No  Grab bars in the bathroom? No  Shower chair or bench in shower? Yes  Elevated toilet seat or a handicapped toilet? Yes    TIMED UP AND GO:  Was the test performed? No .    Depression Screen PHQ 2/9 Scores 04/19/2019 04/16/2018 03/03/2018 01/28/2017  PHQ - 2 Score 0 2 4 0  PHQ- 9 Score - 7 15 -     Cognitive Function: Declined today.      6CIT Screen 04/16/2018  What Year? 0 points  What month? 0 points  What time? 0 points  Count back from 20 0 points  Months in reverse 0 points  Repeat phrase 6 points  Total Score 6    Immunization History  Administered Date(s) Administered  . Pneumococcal Conjugate-13 03/03/2018  . Tdap 05/02/2015    Qualifies for Shingles Vaccine? Yes . Due for Shingrix. Pt has been advised to call insurance company to determine out of pocket expense. Advised  may also receive vaccine at local pharmacy or Health Dept. Verbalized acceptance and understanding.  Tdap: Up to date  Flu Vaccine: Due for Flu vaccine. Does the patient want to receive this vaccine today?  No .   Pneumococcal Vaccine: Due for Pneumococcal vaccine. Does the patient want to receive this vaccine today?  No . Advised may receive this vaccine at local pharmacy or Health Dept. Aware to provide a copy of the vaccination record if obtained from local pharmacy or Health Dept. Verbalized acceptance and understanding.   Screening Tests Health Maintenance  Topic Date Due  . PNA vac Low Risk Adult (2 of 2 - PPSV23) 03/04/2019  . INFLUENZA VACCINE  08/03/2019 (Originally 12/04/2018)  . MAMMOGRAM  07/22/2019  . DEXA SCAN  07/21/2020  . COLONOSCOPY  07/22/2020  . TETANUS/TDAP  05/01/2025  . Hepatitis C Screening  Completed    Cancer Screenings:  Colorectal Screening: Completed 07/23/15. Repeat every 5 years.  Mammogram: Completed 07/22/18. Repeat as advised.  Bone Density: Completed 07/22/18. Results reflect OSTEOPENIA. Repeat every 2 years.   Lung Cancer Screening: (Low Dose CT Chest recommended if Age 50-80 years, 30 pack-year currently smoking OR have quit w/in 15years.) does not qualify.   Additional Screening:  Hepatitis C Screening: Up to date  Vision Screening: Recommended annual ophthalmology exams for early detection of glaucoma and other disorders of the eye.  Dental Screening: Recommended annual dental exams for proper oral hygiene  Community Resource Referral:  CRR required this visit?  No       Plan:  I have personally reviewed and addressed the Medicare Annual Wellness questionnaire and have noted the following in the patient's chart:  A. Medical and social history B. Use of alcohol, tobacco or illicit drugs  C. Current medications and supplements D. Functional ability and status E.  Nutritional status F.  Physical activity G. Advance directives H. List  of other physicians I.  Hospitalizations, surgeries, and ER visits in previous 12 months J.  Vitals K. Screenings such as hearing and  vision if needed, cognitive and depression L. Referrals and appointments   In addition, I have reviewed and discussed with patient certain preventive protocols, quality metrics, and best practice recommendations. A written personalized care plan for preventive services as well as general preventive health recommendations were provided to patient. Nurse Health Advisor  Signed,    Teige Rountree [place], [place]  [id number] Nurse Health Advisor   Nurse Notes: Pt would like to receive her Pneumovax 23 vaccine at next in office apt. Declined a influenza vaccine.

## 2019-04-19 ENCOUNTER — Other Ambulatory Visit: Payer: Self-pay

## 2019-04-19 ENCOUNTER — Ambulatory Visit (INDEPENDENT_AMBULATORY_CARE_PROVIDER_SITE_OTHER): Payer: Medicare Other

## 2019-04-19 DIAGNOSIS — Z0000 Encounter for general adult medical examination without abnormal findings: Secondary | ICD-10-CM

## 2019-04-19 NOTE — Patient Instructions (Signed)
Kathleen Hart , Thank you for taking time to come for your Medicare Wellness Visit. I appreciate your ongoing commitment to your health goals. Please review the following plan we discussed and let me know if I can assist you in the future.   Screening recommendations/referrals: Colonoscopy: Up to date, due 07/2025 Mammogram: Up to date, due 07/2020 Bone Density: Up to date, due 07/2020 Recommended yearly ophthalmology/optometry visit for glaucoma screening and checkup Recommended yearly dental visit for hygiene and checkup  Vaccinations: Influenza vaccine: Pt declines today.  Pneumococcal vaccine: Pneumovax 23 currently due. Pt to receive at next in office apt.  Tdap vaccine: Up to date, due 04/2025 Shingles vaccine: Pt declines today.     Advanced directives: Currently on file.  Conditions/risks identified: None.  Next appointment: 04/22/19 @ 10:00 AM with Dr Brita Romp.   Preventive Care 66 Years and Older, Female Preventive care refers to lifestyle choices and visits with your health care provider that can promote health and wellness. What does preventive care include?  A yearly physical exam. This is also called an annual well check.  Dental exams once or twice a year.  Routine eye exams. Ask your health care provider how often you should have your eyes checked.  Personal lifestyle choices, including:  Daily care of your teeth and gums.  Regular physical activity.  Eating a healthy diet.  Avoiding tobacco and drug use.  Limiting alcohol use.  Practicing safe sex.  Taking low-dose aspirin every day.  Taking vitamin and mineral supplements as recommended by your health care provider. What happens during an annual well check? The services and screenings done by your health care provider during your annual well check will depend on your age, overall health, lifestyle risk factors, and family history of disease. Counseling  Your health care provider may ask you  questions about your:  Alcohol use.  Tobacco use.  Drug use.  Emotional well-being.  Home and relationship well-being.  Sexual activity.  Eating habits.  History of falls.  Memory and ability to understand (cognition).  Work and work Statistician.  Reproductive health. Screening  You may have the following tests or measurements:  Height, weight, and BMI.  Blood pressure.  Lipid and cholesterol levels. These may be checked every 5 years, or more frequently if you are over 66 years old.  Skin check.  Lung cancer screening. You may have this screening every year starting at age 66 if you have a 30-pack-year history of smoking and currently smoke or have quit within the past 15 years.  Fecal occult blood test (FOBT) of the stool. You may have this test every year starting at age 66.  Flexible sigmoidoscopy or colonoscopy. You may have a sigmoidoscopy every 5 years or a colonoscopy every 10 years starting at age 66.  Hepatitis C blood test.  Hepatitis B blood test.  Sexually transmitted disease (STD) testing.  Diabetes screening. This is done by checking your blood sugar (glucose) after you have not eaten for a while (fasting). You may have this done every 1-3 years.  Bone density scan. This is done to screen for osteoporosis. You may have this done starting at age 62.  Mammogram. This may be done every 1-2 years. Talk to your health care provider about how often you should have regular mammograms. Talk with your health care provider about your test results, treatment options, and if necessary, the need for more tests. Vaccines  Your health care provider may recommend certain vaccines, such as:  Influenza vaccine. This is recommended every year.  Tetanus, diphtheria, and acellular pertussis (Tdap, Td) vaccine. You may need a Td booster every 10 years.  Zoster vaccine. You may need this after age 19.  Pneumococcal 13-valent conjugate (PCV13) vaccine. One dose is  recommended after age 32.  Pneumococcal polysaccharide (PPSV23) vaccine. One dose is recommended after age 51. Talk to your health care provider about which screenings and vaccines you need and how often you need them. This information is not intended to replace advice given to you by your health care provider. Make sure you discuss any questions you have with your health care provider. Document Released: 05/18/2015 Document Revised: 01/09/2016 Document Reviewed: 02/20/2015 Elsevier Interactive Patient Education  2017 [place] Prevention in the Home Falls can cause injuries. They can happen to people of all ages. There are many things you can do to make your home safe and to help prevent falls. What can I do on the outside of my home?  Regularly fix the edges of walkways and driveways and fix any cracks.  Remove anything that might make you trip as you walk through a door, such as a raised step or threshold.  Trim any bushes or trees on the path to your home.  Use bright outdoor lighting.  Clear any walking paths of anything that might make someone trip, such as rocks or tools.  Regularly check to see if handrails are loose or broken. Make sure that both sides of any steps have handrails.  Any raised decks and porches should have guardrails on the edges.  Have any leaves, snow, or ice cleared regularly.  Use sand or salt on walking paths during winter.  Clean up any spills in your garage right away. This includes oil or grease spills. What can I do in the bathroom?  Use night lights.  Install grab bars by the toilet and in the tub and shower. Do not use towel bars as grab bars.  Use non-skid mats or decals in the tub or shower.  If you need to sit down in the shower, use a plastic, non-slip stool.  Keep the floor dry. Clean up any water that spills on the floor as soon as it happens.  Remove soap buildup in the tub or shower regularly.  Attach bath mats  securely with double-sided non-slip rug tape.  Do not have throw rugs and other things on the floor that can make you trip. What can I do in the bedroom?  Use night lights.  Make sure that you have a light by your bed that is easy to reach.  Do not use any sheets or blankets that are too big for your bed. They should not hang down onto the floor.  Have a firm chair that has side arms. You can use this for support while you get dressed.  Do not have throw rugs and other things on the floor that can make you trip. What can I do in the kitchen?  Clean up any spills right away.  Avoid walking on wet floors.  Keep items that you use a lot in easy-to-reach places.  If you need to reach something above you, use a strong step stool that has a grab bar.  Keep electrical cords out of the way.  Do not use floor polish or wax that makes floors slippery. If you must use wax, use non-skid floor wax.  Do not have throw rugs and other things on the floor that  can make you trip. What can I do with my stairs?  Do not leave any items on the stairs.  Make sure that there are handrails on both sides of the stairs and use them. Fix handrails that are broken or loose. Make sure that handrails are as long as the stairways.  Check any carpeting to make sure that it is firmly attached to the stairs. Fix any carpet that is loose or worn.  Avoid having throw rugs at the top or bottom of the stairs. If you do have throw rugs, attach them to the floor with carpet tape.  Make sure that you have a light switch at the top of the stairs and the bottom of the stairs. If you do not have them, ask someone to add them for you. What else can I do to help prevent falls?  Wear shoes that:  Do not have high heels.  Have rubber bottoms.  Are comfortable and fit you well.  Are closed at the toe. Do not wear sandals.  If you use a stepladder:  Make sure that it is fully opened. Do not climb a closed  stepladder.  Make sure that both sides of the stepladder are locked into place.  Ask someone to hold it for you, if possible.  Clearly mark and make sure that you can see:  Any grab bars or handrails.  First and last steps.  Where the edge of each step is.  Use tools that help you move around (mobility aids) if they are needed. These include:  Canes.  Walkers.  Scooters.  Crutches.  Turn on the lights when you go into a dark area. Replace any light bulbs as soon as they burn out.  Set up your furniture so you have a clear path. Avoid moving your furniture around.  If any of your floors are uneven, fix them.  If there are any pets around you, be aware of where they are.  Review your medicines with your doctor. Some medicines can make you feel dizzy. This can increase your chance of falling. Ask your doctor what other things that you can do to help prevent falls. This information is not intended to replace advice given to you by your health care provider. Make sure you discuss any questions you have with your health care provider. Document Released: 02/15/2009 Document Revised: 09/27/2015 Document Reviewed: 05/26/2014 Elsevier Interactive Patient Education  2017 [employer].

## 2019-04-22 ENCOUNTER — Encounter: Payer: Self-pay | Admitting: Family Medicine

## 2019-04-22 ENCOUNTER — Other Ambulatory Visit: Payer: Self-pay

## 2019-04-22 ENCOUNTER — Ambulatory Visit (INDEPENDENT_AMBULATORY_CARE_PROVIDER_SITE_OTHER): Payer: Medicare Other | Admitting: Family Medicine

## 2019-04-22 ENCOUNTER — Ambulatory Visit: Payer: Medicare Other

## 2019-04-22 VITALS — BP 106/70 | HR 69 | Temp 96.8°F | Ht 68.0 in | Wt 223.0 lb

## 2019-04-22 DIAGNOSIS — E669 Obesity, unspecified: Secondary | ICD-10-CM | POA: Diagnosis not present

## 2019-04-22 DIAGNOSIS — Z23 Encounter for immunization: Secondary | ICD-10-CM | POA: Diagnosis not present

## 2019-04-22 DIAGNOSIS — K219 Gastro-esophageal reflux disease without esophagitis: Secondary | ICD-10-CM | POA: Diagnosis not present

## 2019-04-22 DIAGNOSIS — Z6833 Body mass index (BMI) 33.0-33.9, adult: Secondary | ICD-10-CM

## 2019-04-22 DIAGNOSIS — Z0000 Encounter for general adult medical examination without abnormal findings: Secondary | ICD-10-CM

## 2019-04-22 DIAGNOSIS — Z1231 Encounter for screening mammogram for malignant neoplasm of breast: Secondary | ICD-10-CM

## 2019-04-22 NOTE — Assessment & Plan Note (Signed)
Well-controlled and currently asymptomatic Continue omeprazole as needed

## 2019-04-22 NOTE — Patient Instructions (Signed)
Preventive Care 66 Years and Older, Female Preventive care refers to lifestyle choices and visits with your health care provider that can promote health and wellness. This includes:  A yearly physical exam. This is also called an annual well check.  Regular dental and eye exams.  Immunizations.  Screening for certain conditions.  Healthy lifestyle choices, such as diet and exercise. What can I expect for my preventive care visit? Physical exam Your health care provider will check:  Height and weight. These may be used to calculate body mass index (BMI), which is a measurement that tells if you are at a healthy weight.  Heart rate and blood pressure.  Your skin for abnormal spots. Counseling Your health care provider may ask you questions about:  Alcohol, tobacco, and drug use.  Emotional well-being.  Home and relationship well-being.  Sexual activity.  Eating habits.  History of falls.  Memory and ability to understand (cognition).  Work and work Statistician.  Pregnancy and menstrual history. What immunizations do I need?  Influenza (flu) vaccine  This is recommended every year. Tetanus, diphtheria, and pertussis (Tdap) vaccine  You may need a Td booster every 10 years. Varicella (chickenpox) vaccine  You may need this vaccine if you have not already been vaccinated. Zoster (shingles) vaccine  You may need this after age 66. Pneumococcal conjugate (PCV13) vaccine  One dose is recommended after age 66. Pneumococcal polysaccharide (PPSV23) vaccine  One dose is recommended after age 66. Measles, mumps, and rubella (MMR) vaccine  You may need at least one dose of MMR if you were born in 1957 or later. You may also need a second dose. Meningococcal conjugate (MenACWY) vaccine  You may need this if you have certain conditions. Hepatitis A vaccine  You may need this if you have certain conditions or if you travel or work in places where you may be exposed  to hepatitis A. Hepatitis B vaccine  You may need this if you have certain conditions or if you travel or work in places where you may be exposed to hepatitis B. Haemophilus influenzae type b (Hib) vaccine  You may need this if you have certain conditions. You may receive vaccines as individual doses or as more than one vaccine together in one shot (combination vaccines). Talk with your health care provider about the risks and benefits of combination vaccines. What tests do I need? Blood tests  Lipid and cholesterol levels. These may be checked every 5 years, or more frequently depending on your overall health.  Hepatitis C test.  Hepatitis B test. Screening  Lung cancer screening. You may have this screening every year starting at age 66 if you have a 30-pack-year history of smoking and currently smoke or have quit within the past 15 years.  Colorectal cancer screening. All adults should have this screening starting at age 66 and continuing until age 15. Your health care provider may recommend screening at age 66 if you are at increased risk. You will have tests every 1-10 years, depending on your results and the type of screening test.  Diabetes screening. This is done by checking your blood sugar (glucose) after you have not eaten for a while (fasting). You may have this done every 1-3 years.  Mammogram. This may be done every 1-2 years. Talk with your health care provider about how often you should have regular mammograms.  BRCA-related cancer screening. This may be done if you have a family history of breast, ovarian, tubal, or peritoneal cancers.  Other tests  Sexually transmitted disease (STD) testing.  Bone density scan. This is done to screen for osteoporosis. You may have this done starting at age 66. Follow these instructions at home: Eating and drinking  Eat a diet that includes fresh fruits and vegetables, whole grains, lean protein, and low-fat dairy products. Limit  your intake of foods with high amounts of sugar, saturated fats, and salt.  Take vitamin and mineral supplements as recommended by your health care provider.  Do not drink alcohol if your health care provider tells you not to drink.  If you drink alcohol: ? Limit how much you have to 0-1 drink a day. ? Be aware of how much alcohol is in your drink. In the [place] one drink equals one 12 oz bottle of beer (355 mL), one 5 oz glass of wine (148 mL), or one 1 oz glass of hard liquor (44 mL). Lifestyle  Take daily care of your teeth and gums.  Stay active. Exercise for at least 30 minutes on 5 or more days each week.  Do not use any products that contain nicotine or tobacco, such as cigarettes, e-cigarettes, and chewing tobacco. If you need help quitting, ask your health care provider.  If you are sexually active, practice safe sex. Use a condom or other form of protection in order to prevent STIs (sexually transmitted infections).  Talk with your health care provider about taking a low-dose aspirin or statin. What's next?  Go to your health care provider once a year for a well check visit.  Ask your health care provider how often you should have your eyes and teeth checked.  Stay up to date on all vaccines. This information is not intended to replace advice given to you by your health care provider. Make sure you discuss any questions you have with your health care provider. Document Released: 05/18/2015 Document Revised: 04/15/2018 Document Reviewed: 04/15/2018 Elsevier Patient Education  2020 [employer].

## 2019-04-22 NOTE — Progress Notes (Signed)
Patient: Kathleen Hart, Female    DOB: 05-18-1950, 68 y.o.   MRN: [id number] Visit Date: 04/22/2019  Today's Provider: Lavon Paganini, MD   Chief Complaint  Patient presents with  . Annual Exam   Subjective:     Complete Physical Kathleen Hart is a 68 y.o. female. She feels well. She reports exercising regularly. She reports she is sleeping well.  ----------------------------------------------------------- She is trying intermittent fasting with 1 meal a day to help with weight loss.  She has been trying this for a couple weeks.  She has not noticed much difference home scale, but she feels like her waistline is getting smaller.  States she has tried [employer], keto, and several other diets previously.  Review of Systems  Constitutional: Negative.   HENT: Negative.   Eyes: Negative.   Respiratory: Negative.   Cardiovascular: Negative.   Gastrointestinal: Negative.   Endocrine: Negative.   Genitourinary: Negative.   Musculoskeletal: Positive for arthralgias. Negative for back pain, gait problem, joint swelling, myalgias, neck pain and neck stiffness.  Skin: Negative.   Allergic/Immunologic: Negative.   Neurological: Negative.   Hematological: Negative.   Psychiatric/Behavioral: Negative.     Social History   Socioeconomic History  . Marital status: Married    Spouse name: Not on file  . Number of children: 2  . Years of education: Not on file  . Highest education level: Not on file  Occupational History  . 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 Smoking status: Never Smoker  . Smokeless tobacco: Never Used  Substance and Sexual Activity  . Alcohol use: Yes    Comment: occasional, social  . Drug use: No  . Sexual activity: Not Currently  Other Topics Concern  . Not on file  Social History Narrative  . Not on file   Social Determinants of Health   Financial Resource Strain:   . Difficulty of Paying Living  Expenses: Not on file  Food Insecurity:   . Worried About Charity fundraiser in the Last Year: Not on file  . Ran Out of Food in the Last Year: Not on file  Transportation Needs:   . Lack of Transportation (Medical): Not on file  . Lack of Transportation (Non-Medical): Not on file  Physical Activity:   . Days of Exercise per Week: Not on file  . Minutes of Exercise per Session: Not on file  Stress:   . Feeling of Stress : Not on file  Social Connections:   . Frequency of Communication with Friends and Family: Not on file  . Frequency of Social Gatherings with Friends and Family: Not on file  . Attends Religious Services: Not on file  . Active Member of Clubs or Organizations: Not on file  . Attends Archivist Meetings: Not on file  . Marital Status: Not on file  Intimate Partner Violence:   . Fear of Current or Ex-Partner: Not on file  . Emotionally Abused: Not on file  . Physically Abused: Not on file  . Sexually Abused: Not on file    Past Medical History:  Diagnosis Date  . Chronic back pain   . Colon polyps   . GERD (gastroesophageal reflux disease)   . Heart murmur   . Osteoarthritis      Patient Active Problem List   Diagnosis Date Noted  . Status post total knee replacement using cement 03/10/2018  . Status post total knee replacement using  cement, right 03/09/2018  . Class 1 obesity without serious comorbidity with body mass index (BMI) of 33.0 to 33.9 in adult 03/03/2018  . Chronic fatigue 03/03/2018  . Osteoarthritis 10/18/2015  . Chronic pain 10/18/2015  . Gastroesophageal reflux disease 06/04/2015  . Colon polyps 06/04/2015    Past Surgical History:  Procedure Laterality Date  . ABDOMINOPLASTY    . AUGMENTATION MAMMAPLASTY    . BREAST BIOPSY    . COLONOSCOPY    . COLONOSCOPY WITH PROPOFOL N/A 07/23/2015   Procedure: COLONOSCOPY WITH PROPOFOL;  Surgeon: Kathleen Sails, MD;  Location: [facility] ENDOSCOPY;  Service: Endoscopy;  Laterality: N/A;   . DILATION AND CURETTAGE OF UTERUS    . DILATION AND CURETTAGE, DIAGNOSTIC / THERAPEUTIC    . TOTAL KNEE ARTHROPLASTY Right 03/09/2018   Procedure: TOTAL KNEE ARTHROPLASTY;  Surgeon: Corky Mull, MD;  Location: [facility] ORS;  Service: Orthopedics;  Laterality: Right;    Her family history includes Arthritis in her mother and sister; Bladder Cancer in her maternal uncle; COPD in her mother; Cancer in her mother and sister; Cancer (age of onset: 32) in her father; Kidney disease in her mother.   Current Outpatient Medications:  .  CALCIUM-MAGNESIUM-ZINC PO, Take by mouth daily., Disp: , Rfl:  .  Cholecalciferol (VITAMIN D3) 50 MCG (2000 UT) capsule, Take 4,000 Units by mouth daily., Disp: , Rfl:  .  fluticasone (FLONASE) 50 MCG/ACT nasal spray, SPRAY 2 SPRAYS INTO EACH NOSTRIL EVERY DAY, Disp: 48 mL, Rfl: 2 .  ibuprofen (ADVIL,MOTRIN) 200 MG tablet, Take 400 mg by mouth every 6 (six) hours as needed for headache or moderate pain., Disp: , Rfl:  .  Multiple Vitamin (MULTIVITAMIN) tablet, Take 1 tablet by mouth daily., Disp: , Rfl:  .  omeprazole (PRILOSEC OTC) 20 MG tablet, Take 1 tablet (20 mg total) by mouth daily as needed (for acid reflux)., Disp: 90 tablet, Rfl: 3  Patient Care Team: Ceazia Crews, MD as PCP - General (Family Medicine) Ralene Bathe, MD (Dermatology) Jillyn Hidden D as Referring Physician (Chiropractic Medicine)     Objective:    Vitals: BP 106/70 (BP Location: Right Arm, Patient Position: Sitting, Cuff Size: Large)   Pulse 69   Temp (!) 96.8 F (36 C) (Temporal)   Ht 5\' 8"  (1.727 m)   Wt 223 lb (101.2 kg)   BMI 33.91 kg/m   Physical Exam Vitals reviewed.  Constitutional:      General: She is not in acute distress.    Appearance: Normal appearance. She is well-developed. She is not diaphoretic.  HENT:     Head: Normocephalic and atraumatic.     Right Ear: Tympanic membrane, ear canal and external ear normal.     Left Ear: Tympanic membrane, ear  canal and external ear normal.     Nose: Nose normal.     Mouth/Throat:     Mouth: Mucous membranes are moist.     Pharynx: Oropharynx is clear. No oropharyngeal exudate.  Eyes:     General: No scleral icterus.    Conjunctiva/sclera: Conjunctivae normal.     Pupils: Pupils are equal, round, and reactive to light.  Neck:     Thyroid: No thyromegaly.  Cardiovascular:     Rate and Rhythm: Normal rate and regular rhythm.     Pulses: Normal pulses.     Heart sounds: Normal heart sounds. No murmur.  Pulmonary:     Effort: Pulmonary effort is normal. No respiratory distress.  Breath sounds: Normal breath sounds. No wheezing or rales.  Abdominal:     General: There is no distension.     Palpations: Abdomen is soft.     Tenderness: There is no abdominal tenderness. There is no guarding or rebound.  Musculoskeletal:        General: No deformity.     Cervical back: Neck supple.     Right lower leg: No edema.     Left lower leg: No edema.  Lymphadenopathy:     Cervical: No cervical adenopathy.  Skin:    General: Skin is warm and dry.     Capillary Refill: Capillary refill takes less than 2 seconds.     Findings: No rash.  Neurological:     Mental Status: She is alert and oriented to person, place, and time. Mental status is at baseline.  Psychiatric:        Mood and Affect: Mood normal.        Behavior: Behavior normal.        Thought Content: Thought content normal.     Activities of Daily Living In your present state of health, do you have any difficulty performing the following activities: 04/19/2019  Hearing? N  Vision? N  Difficulty concentrating or making decisions? N  Walking or climbing stairs? N  Dressing or bathing? N  Doing errands, shopping? N  Preparing Food and eating ? N  Using the Toilet? N  In the past six months, have you accidently leaked urine? N  Do you have problems with loss of bowel control? N  Managing your Medications? N  Managing your Finances?  N  Housekeeping or managing your Housekeeping? N  Some recent data might be hidden    Fall Risk Assessment Fall Risk  04/19/2019 04/16/2018 03/03/2018 03/03/2018 01/28/2017  Falls in the past year? 0 0 No No No  Number falls in past yr: 0 - - - -  Injury with Fall? 0 - - - -     Depression Screen PHQ 2/9 Scores 04/19/2019 04/16/2018 03/03/2018 01/28/2017  PHQ - 2 Score 0 2 4 0  PHQ- 9 Score - 7 15 -    6CIT Screen 04/16/2018  What Year? 0 points  What month? 0 points  What time? 0 points  Count back from 20 0 points  Months in reverse 0 points  Repeat phrase 6 points  Total Score 6       Assessment & Plan:    Annual Physical Reviewed patient's Family Medical History Reviewed and updated list of patient's medical providers Assessment of cognitive impairment was done Assessed patient's functional ability Established a written schedule for health screening [place] Completed and Reviewed  Exercise Activities and Dietary recommendations Goals   None     Immunization History  Administered Date(s) Administered  . Pneumococcal Conjugate-13 03/03/2018  . Pneumococcal Polysaccharide-23 04/22/2019  . Tdap 05/02/2015    Health Maintenance  Topic Date Due  . INFLUENZA VACCINE  08/03/2019 (Originally 12/04/2018)  . MAMMOGRAM  07/22/2019  . DEXA SCAN  07/21/2020  . COLONOSCOPY  07/22/2020  . TETANUS/TDAP  05/01/2025  . Hepatitis C Screening  Completed  . PNA vac Low Risk Adult  Completed     Discussed health benefits of physical activity, and encouraged her to engage in regular exercise appropriate for her age and condition.    ------------------------------------------------------------------------------------------------------------  Problem List Items Addressed This Visit      Digestive   Gastroesophageal reflux disease  Well-controlled and currently asymptomatic Continue omeprazole as needed        Other   Class 1 obesity  without serious comorbidity with body mass index (BMI) of 33.0 to 33.9 in adult    Discussed importance of healthy weight management Discussed diet and exercise Continue intermittent fasting Screening labs today Consider referral to [facility] healthy weight and wellness clinic for bariatric medications possibly in the future      Relevant Orders   Lipid panel   CBC   CMP (Comprehensive metabolic panel)   TSH    Other Visit Diagnoses    Encounter for annual physical exam    -  Primary   Relevant Orders   Lipid panel   CBC   CMP (Comprehensive metabolic panel)   TSH   Need for pneumococcal vaccination       Relevant Orders   Pneumococcal polysaccharide vaccine 23-valent greater than or equal to 2yo subcutaneous/IM (Completed)   Encounter for screening mammogram for malignant neoplasm of breast       Relevant Orders   MM 3D SCREEN BREAST BILATERAL       Return in about 1 year (around 04/21/2020) for CPE.   The entirety of the information documented in the History of Present Illness, Review of Systems and Physical Exam were personally obtained by me. Portions of this information were initially documented by Ashley Royalty, CMA and reviewed by me for thoroughness and accuracy.    Bacigalupo, Dionne Bucy, MD MPH [facility]

## 2019-04-22 NOTE — Assessment & Plan Note (Signed)
Discussed importance of healthy weight management Discussed diet and exercise Continue intermittent fasting Screening labs today Consider referral to [facility] healthy weight and wellness clinic for bariatric medications possibly in the future

## 2019-04-23 LAB — COMPREHENSIVE METABOLIC PANEL
ALT: 21 IU/L (ref 0–32)
AST: 24 IU/L (ref 0–40)
Albumin/Globulin Ratio: 2.3 — ABNORMAL HIGH (ref 1.2–2.2)
Albumin: 4.5 g/dL (ref 3.8–4.8)
Alkaline Phosphatase: 108 IU/L (ref 39–117)
BUN/Creatinine Ratio: 19 (ref 12–28)
BUN: 17 mg/dL (ref 8–27)
Bilirubin Total: 1.4 mg/dL — ABNORMAL HIGH (ref 0.0–1.2)
CO2: 24 mmol/L (ref 20–29)
Calcium: 9.7 mg/dL (ref 8.7–10.3)
Chloride: 102 mmol/L (ref 96–106)
Creatinine, Ser: 0.89 mg/dL (ref 0.57–1.00)
GFR calc Af Amer: 78 mL/min/{1.73_m2} (ref >59)
GFR calc non Af Amer: 68 mL/min/{1.73_m2} (ref >59)
Globulin, Total: 2 g/dL (ref 1.5–4.5)
Glucose: 106 mg/dL — ABNORMAL HIGH (ref 65–99)
Potassium: 4.2 mmol/L (ref 3.5–5.2)
Sodium: 141 mmol/L (ref 134–144)
Total Protein: 6.5 g/dL (ref 6.0–8.5)

## 2019-04-23 LAB — LIPID PANEL
Chol/HDL Ratio: 2.9 ratio (ref 0.0–4.4)
Cholesterol, Total: 163 mg/dL (ref 100–199)
HDL: 57 mg/dL (ref >39)
LDL Chol Calc (NIH): 92 mg/dL (ref 0–99)
Triglycerides: 72 mg/dL (ref 0–149)
VLDL Cholesterol Cal: 14 mg/dL (ref 5–40)

## 2019-04-23 LAB — CBC
Hematocrit: 38 % (ref 34.0–46.6)
Hemoglobin: 12.9 g/dL (ref 11.1–15.9)
MCH: 29.7 pg (ref 26.6–33.0)
MCHC: 33.9 g/dL (ref 31.5–35.7)
MCV: 87 fL (ref 79–97)
Platelets: 210 10*3/uL (ref 150–450)
RBC: 4.35 x10E6/uL (ref 3.77–5.28)
RDW: 13.1 % (ref 11.7–15.4)
WBC: 6 10*3/uL (ref 3.4–10.8)

## 2019-04-23 LAB — TSH: TSH: 2.13 u[IU]/mL (ref 0.450–4.500)

## 2019-04-25 ENCOUNTER — Telehealth: Payer: Self-pay

## 2019-04-25 NOTE — Telephone Encounter (Signed)
-----   Message from Porschea Crews, MD sent at 04/25/2019  8:39 AM EST ----- Normal labs

## 2019-04-25 NOTE — Telephone Encounter (Signed)
Pt advised.   Thanks,   -Laura  

## 2019-07-29 LAB — HM MAMMOGRAPHY

## 2019-08-02 ENCOUNTER — Encounter: Payer: Self-pay | Admitting: Family Medicine

## 2020-05-01 NOTE — Progress Notes (Deleted)
Subjective:   Kathleen Hart is a 69 y.o. female who presents for Medicare Annual (Subsequent) preventive examination.  Review of Systems    N/A        Objective:    There were no vitals filed for this visit. There is no height or weight on file to calculate BMI.  Advanced Directives 04/19/2019 03/09/2018 03/09/2018 02/24/2018  Does Patient Have a Medical Advance Directive? Yes Yes Yes Yes  Type of Estate agent of [place];Living will Healthcare Power of [place];Living will Healthcare Power of [place];Living will Healthcare Power of [place];Living will  Does patient want to make changes to medical advance directive? - No - Patient declined - -  Copy of Healthcare Power of Attorney in Chart? Yes - validated most recent copy scanned in chart (See row information) Yes - validated most recent copy scanned in chart (See row information) Yes No - copy requested    Current Medications (verified) Outpatient Encounter Medications as of 05/08/2020  Medication Sig  . CALCIUM-MAGNESIUM-ZINC PO Take by mouth daily.  . Cholecalciferol (VITAMIN D3) 50 MCG (2000 UT) capsule Take 4,000 Units by mouth daily.  . fluticasone (FLONASE) 50 MCG/ACT nasal spray SPRAY 2 SPRAYS INTO EACH NOSTRIL EVERY DAY  . ibuprofen (ADVIL,MOTRIN) 200 MG tablet Take 400 mg by mouth every 6 (six) hours as needed for headache or moderate pain.  . Multiple Vitamin (MULTIVITAMIN) tablet Take 1 tablet by mouth daily.  Marland Kitchen omeprazole (PRILOSEC OTC) 20 MG tablet Take 1 tablet (20 mg total) by mouth daily as needed (for acid reflux).   No facility-administered encounter medications on file as of 05/08/2020.    Allergies (verified) Sulfa antibiotics   History: Past Medical History:  Diagnosis Date  . Chronic back pain   . Colon polyps   . GERD (gastroesophageal reflux disease)   . Heart murmur   . Osteoarthritis    Past Surgical History:  Procedure Laterality Date  . ABDOMINOPLASTY    .  AUGMENTATION MAMMAPLASTY    . BREAST BIOPSY    . COLONOSCOPY    . COLONOSCOPY WITH PROPOFOL N/A 07/23/2015   Procedure: COLONOSCOPY WITH PROPOFOL;  Surgeon: Christena Deem, MD;  Location: [facility] ENDOSCOPY;  Service: Endoscopy;  Laterality: N/A;  . DILATION AND CURETTAGE OF UTERUS    . DILATION AND CURETTAGE, DIAGNOSTIC / THERAPEUTIC    . TOTAL KNEE ARTHROPLASTY Right 03/09/2018   Procedure: TOTAL KNEE ARTHROPLASTY;  Surgeon: Christena Flake, MD;  Location: [facility] ORS;  Service: Orthopedics;  Laterality: Right;   Family History  Problem Relation Age of Onset  . Arthritis Mother   . Kidney disease Mother   . Cancer Mother        bladder  . COPD Mother        smoker  . Cancer Father 47       prostate, low grade  . Cancer Sister        Cervical cancer  . Arthritis Sister   . Bladder Cancer Maternal Uncle    Social History   Socioeconomic History  . Marital status: Married    Spouse name: Not on file  . Number of children: 2  . Years of education: Not on file  . Highest education level: Not on file  Occupational History  . 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 Smoking status: Never Smoker  . Smokeless tobacco: Never Used  Vaping Use  . Vaping Use: Never used  Substance and Sexual  Activity  . Alcohol use: Yes    Comment: occasional, social  . Drug use: No  . Sexual activity: Not Currently  Other Topics Concern  . Not on file  Social History Narrative  . Not on file   Social Determinants of Health   Financial Resource Strain: Not on file  Food Insecurity: Not on file  Transportation Needs: Not on file  Physical Activity: Not on file  Stress: Not on file  Social Connections: Not on file    Tobacco Counseling Counseling given: Not Answered   Clinical Intake:                 Diabetic? No         Activities of Daily Living No flowsheet data found.  Patient Care Team: Brendalee Crews, MD as PCP - General (Family  Medicine) Ralene Bathe, MD (Dermatology) Jillyn Hidden D as Referring Physician (Chiropractic Medicine)  Indicate any recent Medical Services you may have received from other than [facility] providers in the past year (date may be approximate).     Assessment:   This is a routine wellness examination for [place].  Hearing/Vision screen No exam data present  Dietary issues and exercise activities discussed:    Goals   None    Depression Screen PHQ 2/9 Scores 04/19/2019 04/16/2018 03/03/2018 01/28/2017  PHQ - 2 Score 0 2 4 0  PHQ- 9 Score - 7 15 -    Fall Risk Fall Risk  04/19/2019 04/16/2018 03/03/2018 03/03/2018 01/28/2017  Falls in the past year? 0 0 No No No  Number falls in past yr: 0 - - - -  Injury with Fall? 0 - - - -    FALL RISK PREVENTION PERTAINING TO THE HOME:  Any stairs in or around the home? {YES/NO:21197} If so, are there any without handrails? {YES/NO:21197} Home free of loose throw rugs in walkways, pet beds, electrical cords, etc? Yes  Adequate lighting in your home to reduce risk of falls? Yes   ASSISTIVE DEVICES UTILIZED TO PREVENT FALLS:  Life alert? {YES/NO:21197} Use of a cane, walker or w/c? {YES/NO:21197} Grab bars in the bathroom? {YES/NO:21197} Shower chair or bench in shower? {YES/NO:21197} Elevated toilet seat or a handicapped toilet? {YES/NO:21197}   Cognitive Function:     6CIT Screen 04/16/2018  What Year? 0 points  What month? 0 points  What time? 0 points  Count back from 20 0 points  Months in reverse 0 points  Repeat phrase 6 points  Total Score 6    Immunizations Immunization History  Administered Date(s) Administered  . Pneumococcal Conjugate-13 03/03/2018  . Pneumococcal Polysaccharide-23 04/22/2019  . Tdap 05/02/2015    TDAP status: Up to date  {Flu Vaccine status:2101806}  Pneumococcal vaccine status: Up to date  {Covid-19 vaccine status:2101808}  Qualifies for Shingles Vaccine? Yes   Zostavax  completed No   Shingrix Completed?: No.    Education has been provided regarding the importance of this vaccine. Patient has been advised to call insurance company to determine out of pocket expense if they have not yet received this vaccine. Advised may also receive vaccine at local pharmacy or Health Dept. Verbalized acceptance and understanding.  Screening Tests Health Maintenance  Topic Date Due  . COVID-19 Vaccine (1) Never done  . INFLUENZA VACCINE  Never done  . DEXA SCAN  07/21/2020  . COLONOSCOPY (Pts 45-28yrs Insurance coverage will need to be confirmed)  07/22/2020  . MAMMOGRAM  07/28/2020  . TETANUS/TDAP  05/01/2025  .  Hepatitis C Screening  Completed  . PNA vac Low Risk Adult  Completed    Health Maintenance  Health Maintenance Due  Topic Date Due  . COVID-19 Vaccine (1) Never done  . INFLUENZA VACCINE  Never done    Colorectal cancer screening: Type of screening: Colonoscopy. Completed 07/23/15. Repeat every 5 years  Mammogram status: Completed 07/29/19. Repeat every year  Bone Density status: Completed 07/22/18. Results reflect: Bone density results: OSTEOPENIA. Repeat every 2 years.  Lung Cancer Screening: (Low Dose CT Chest recommended if Age 22-80 years, 30 pack-year currently smoking OR have quit w/in 15years.) {DOES NOT does:27190::"does not"} qualify.   Lung Cancer Screening Referral: ***  Additional Screening:  Hepatitis C Screening: Up to date  Vision Screening: Recommended annual ophthalmology exams for early detection of glaucoma and other disorders of the eye. Is the patient up to date with their annual eye exam?  Yes  Who is the provider or what is the name of the office in which the patient attends annual eye exams? *** If pt is not established with a provider, would they like to be referred to a provider to establish care? No .   Dental Screening: Recommended annual dental exams for proper oral hygiene  Community Resource Referral / Chronic Care  Management: CRR required this visit?  No   CCM required this visit?  No      Plan:     I have personally reviewed and noted the following in the patient's chart:   . Medical and social history . Use of alcohol, tobacco or illicit drugs  . Current medications and supplements . Functional ability and status . Nutritional status . Physical activity . Advanced directives . List of other physicians . Hospitalizations, surgeries, and ER visits in previous 12 months . Vitals . Screenings to include cognitive, depression, and falls . Referrals and appointments  In addition, I have reviewed and discussed with patient certain preventive protocols, quality metrics, and best practice recommendations. A written personalized care plan for preventive services as well as general preventive health recommendations were provided to patient.     Kenzel Ruesch [place], [place]   22/29/7989   Nurse Notes: ***

## 2020-05-05 DIAGNOSIS — I480 Paroxysmal atrial fibrillation: Secondary | ICD-10-CM

## 2020-05-05 HISTORY — DX: Paroxysmal atrial fibrillation: I48.0

## 2020-05-08 ENCOUNTER — Ambulatory Visit: Payer: Medicare Other

## 2020-05-08 ENCOUNTER — Encounter: Payer: Medicare Other | Admitting: Family Medicine

## 2020-05-21 ENCOUNTER — Telehealth: Payer: Self-pay | Admitting: Family Medicine

## 2020-05-21 NOTE — Telephone Encounter (Signed)
Copied from [place] [id number]. Topic: Medicare AWV >> May 21, 2020  2:31 PM Cher Nakai R wrote: Reason for CRM:   Left message for patient to call back and schedule Medicare Annual Wellness Visit (AWV) in office.   If not able to come in office, please offer to do virtually.   Last AWV 04/19/2019  Please schedule at anytime with [facility] Health Advisor.  If any questions, please contact me at [phone]

## 2020-07-24 ENCOUNTER — Telehealth: Payer: Self-pay | Admitting: Family Medicine

## 2020-07-24 NOTE — Telephone Encounter (Signed)
Copied from [place] [id number]. Topic: Medicare AWV >> Jul 24, 2020  2:22 PM Cher Nakai R wrote: Reason for CRM:   Left message for patient to call back and schedule Medicare Annual Wellness Visit (AWV) in office.   If not able to come in office, please offer to do virtually or by telephone.   Last AWV:  04/19/2019  Please schedule at anytime with [facility] Health Advisor.  If any questions, please contact me at [phone]

## 2021-02-26 ENCOUNTER — Other Ambulatory Visit: Payer: Self-pay | Admitting: Family Medicine

## 2021-02-26 NOTE — Telephone Encounter (Signed)
Requested medications are due for refill today.  yes  Requested medications are on the active medications list.  yes  Last refill. 11/19/2018  Future visit scheduled.   no  Notes to clinic.  Pt last seen 04/22/2019. More than 3 months overdue for office visit.

## 2021-04-22 ENCOUNTER — Encounter: Payer: Self-pay | Admitting: Family Medicine

## 2021-04-22 ENCOUNTER — Other Ambulatory Visit: Payer: Self-pay

## 2021-04-22 ENCOUNTER — Ambulatory Visit: Payer: Medicare PPO | Admitting: Family Medicine

## 2021-04-22 VITALS — BP 115/82 | HR 80 | Temp 98.2°F | Resp 16 | Ht 69.0 in | Wt 236.6 lb

## 2021-04-22 DIAGNOSIS — R739 Hyperglycemia, unspecified: Secondary | ICD-10-CM

## 2021-04-22 DIAGNOSIS — R0789 Other chest pain: Secondary | ICD-10-CM | POA: Diagnosis not present

## 2021-04-22 DIAGNOSIS — E669 Obesity, unspecified: Secondary | ICD-10-CM

## 2021-04-22 DIAGNOSIS — R002 Palpitations: Secondary | ICD-10-CM

## 2021-04-22 DIAGNOSIS — Z6834 Body mass index (BMI) 34.0-34.9, adult: Secondary | ICD-10-CM

## 2021-04-22 NOTE — Progress Notes (Signed)
Established patient visit   Patient: Kathleen Hart   DOB: Apr 18, 1951   70 y.o. Female  MRN: [id number] Visit Date: 04/22/2021  Today's healthcare provider: Lavon Paganini, MD   Chief Complaint  Patient presents with   Irregular Heart Beat   Obesity   I,Sulibeya S Dimas,acting as a scribe for Lavon Paganini, MD.,have documented all relevant documentation on the behalf of Lavon Paganini, MD,as directed by  Lavon Paganini, MD while in the presence of Lavon Paganini, MD.  Subjective    HPI  Patient here today C/O irregular heart rate. She reports she went to donate blood this morning and was advised to see PCP due to heart rate. She did not donate blood. HR was fast and then slow and all over the place. Feels palpitations from time to time. Doesn't happen often.     Patient reports some chest tightness on and off. Last few weeks intermittently.  Thought it was GERD. No SOB, diaphoresis, nausea associated with chest tightness. Lasted 30 mins each time. Came on at rest.   Patient reports not being able to lose weight, reports trying fasting and many diets and has not been able to lose weight. Patient reports not being able exercise due to bad knees.  Now cutting back on portions.   Medications: Outpatient Medications Prior to Visit  Medication Sig   CALCIUM-MAGNESIUM-ZINC PO Take by mouth daily.   Cholecalciferol (VITAMIN D3) 50 MCG (2000 UT) capsule Take 4,000 Units by mouth daily.   fluticasone (FLONASE) 50 MCG/ACT nasal spray SPRAY 2 SPRAYS INTO EACH NOSTRIL EVERY DAY   ibuprofen (ADVIL,MOTRIN) 200 MG tablet Take 400 mg by mouth every 6 (six) hours as needed for headache or moderate pain.   Multiple Vitamin (MULTIVITAMIN) tablet Take 1 tablet by mouth daily.   omeprazole (PRILOSEC OTC) 20 MG tablet Take 1 tablet (20 mg total) by mouth daily as needed (for acid reflux).   No facility-administered medications prior to visit.    Review of Systems   Constitutional:  Positive for appetite change. Negative for fatigue.  Respiratory:  Positive for chest tightness. Negative for cough and shortness of breath.   Cardiovascular:  Positive for chest pain and palpitations.  Psychiatric/Behavioral:  Positive for sleep disturbance.       Objective    BP 115/82 (BP Location: Right Arm, Patient Position: Sitting, Cuff Size: Large)    Pulse 80    Temp 98.2 F (36.8 C) (Oral)    Resp 16    Ht 5\' 9"  (1.753 m)    Wt 236 lb 9.6 oz (107.3 kg)    SpO2 98%    BMI 34.94 kg/m  {Show previous vital signs (optional):23777}  Physical Exam Vitals reviewed.  Constitutional:      General: She is not in acute distress.    Appearance: Normal appearance. She is well-developed. She is not diaphoretic.  HENT:     Head: Normocephalic and atraumatic.  Eyes:     General: No scleral icterus.    Conjunctiva/sclera: Conjunctivae normal.  Neck:     Thyroid: No thyromegaly.  Cardiovascular:     Rate and Rhythm: Normal rate.     Pulses: Normal pulses.     Heart sounds: Normal heart sounds. No murmur heard.    Comments: 1 dropped beat noted in otherwise regular rhythm. Pulmonary:     Effort: Pulmonary effort is normal. No respiratory distress.     Breath sounds: Normal breath sounds. No wheezing, rhonchi or rales.  Musculoskeletal:     Cervical back: Neck supple.     Right lower leg: No edema.     Left lower leg: No edema.  Lymphadenopathy:     Cervical: No cervical adenopathy.  Skin:    General: Skin is warm and dry.     Findings: No rash.  Neurological:     Mental Status: She is alert and oriented to person, place, and time. Mental status is at baseline.  Psychiatric:        Mood and Affect: Mood normal.        Behavior: Behavior normal.     EKG: Normal rate, frequent ventricular ectopy, normal p waves  No results found for any visits on 04/22/21.  Assessment & Plan     Problem List Items Addressed This Visit       Other   Class 1 obesity  without serious comorbidity with body mass index (BMI) of 34.0 to 34.9 in adult    Discussed importance of healthy weight management Discussed diet and exercise Referral to healthy weight and wellness      Relevant Orders   Amb Ref to Medical Weight Management   Comprehensive metabolic panel   Lipid panel   TSH   CBC   Palpitations - Primary    New problem Noted on exam and EKG to have ectopic beats Check for underlying causes with labs today Referral to cardiology for further evaluation and management May need beta-blocker      Relevant Orders   Comprehensive metabolic panel   TSH   CBC   Ambulatory referral to Cardiology   EKG 12-Lead (Completed)   Chest tightness    New problem Occurs at rest and not exertionally and does not have any concerning associated symptoms, which is reassuring That being said, as she is having ventricular ectopy and will be seeing cardiology anyway, its not a bad idea for her to have a cardiac evaluation May consider cardiac CT for restratification      Relevant Orders   Ambulatory referral to Cardiology   Other Visit Diagnoses     Hyperglycemia       Relevant Orders   Hemoglobin A1c        Return in about 4 months (around 08/21/2021) for CPE.      I, Lavon Paganini, MD, have reviewed all documentation for this visit. The documentation on 04/22/21 for the exam, diagnosis, procedures, and orders are all accurate and complete.   Chantilly Linskey, Dionne Bucy, MD, MPH [facility]

## 2021-04-22 NOTE — Assessment & Plan Note (Signed)
New problem Occurs at rest and not exertionally and does not have any concerning associated symptoms, which is reassuring That being said, as she is having ventricular ectopy and will be seeing cardiology anyway, its not a bad idea for her to have a cardiac evaluation May consider cardiac CT for restratification

## 2021-04-22 NOTE — Assessment & Plan Note (Signed)
New problem Noted on exam and EKG to have ectopic beats Check for underlying causes with labs today Referral to cardiology for further evaluation and management May need beta-blocker

## 2021-04-22 NOTE — Assessment & Plan Note (Signed)
Discussed importance of healthy weight management Discussed diet and exercise Referral to healthy weight and wellness

## 2021-04-25 LAB — COMPREHENSIVE METABOLIC PANEL
ALT: 19 IU/L (ref 0–32)
AST: 21 IU/L (ref 0–40)
Albumin/Globulin Ratio: 1.7 (ref 1.2–2.2)
Albumin: 4.2 g/dL (ref 3.8–4.8)
Alkaline Phosphatase: 104 IU/L (ref 44–121)
BUN/Creatinine Ratio: 16 (ref 12–28)
BUN: 14 mg/dL (ref 8–27)
Bilirubin Total: 1.2 mg/dL (ref 0.0–1.2)
CO2: 24 mmol/L (ref 20–29)
Calcium: 9.6 mg/dL (ref 8.7–10.3)
Chloride: 104 mmol/L (ref 96–106)
Creatinine, Ser: 0.86 mg/dL (ref 0.57–1.00)
Globulin, Total: 2.5 g/dL (ref 1.5–4.5)
Glucose: 105 mg/dL — ABNORMAL HIGH (ref 70–99)
Potassium: 4.4 mmol/L (ref 3.5–5.2)
Sodium: 141 mmol/L (ref 134–144)
Total Protein: 6.7 g/dL (ref 6.0–8.5)
eGFR: 74 mL/min/{1.73_m2} (ref >59)

## 2021-04-25 LAB — CBC
Hematocrit: 40.8 % (ref 34.0–46.6)
Hemoglobin: 13.2 g/dL (ref 11.1–15.9)
MCH: 27.6 pg (ref 26.6–33.0)
MCHC: 32.4 g/dL (ref 31.5–35.7)
MCV: 85 fL (ref 79–97)
Platelets: 249 10*3/uL (ref 150–450)
RBC: 4.78 x10E6/uL (ref 3.77–5.28)
RDW: 14.6 % (ref 11.7–15.4)
WBC: 7.5 10*3/uL (ref 3.4–10.8)

## 2021-04-25 LAB — HEMOGLOBIN A1C
Est. average glucose Bld gHb Est-mCnc: 120 mg/dL
Hgb A1c MFr Bld: 5.8 % — ABNORMAL HIGH (ref 4.8–5.6)

## 2021-04-25 LAB — LIPID PANEL
Chol/HDL Ratio: 3.7 ratio (ref 0.0–4.4)
Cholesterol, Total: 176 mg/dL (ref 100–199)
HDL: 48 mg/dL (ref >39)
LDL Chol Calc (NIH): 106 mg/dL — ABNORMAL HIGH (ref 0–99)
Triglycerides: 123 mg/dL (ref 0–149)
VLDL Cholesterol Cal: 22 mg/dL (ref 5–40)

## 2021-04-25 LAB — TSH: TSH: 2.98 u[IU]/mL (ref 0.450–4.500)

## 2021-05-01 LAB — HM MAMMOGRAPHY

## 2021-05-05 DIAGNOSIS — I471 Supraventricular tachycardia: Secondary | ICD-10-CM

## 2021-05-05 DIAGNOSIS — R002 Palpitations: Secondary | ICD-10-CM

## 2021-05-05 DIAGNOSIS — I4719 Other supraventricular tachycardia: Secondary | ICD-10-CM

## 2021-05-05 HISTORY — DX: Other supraventricular tachycardia: I47.19

## 2021-05-05 HISTORY — DX: Supraventricular tachycardia: I47.1

## 2021-05-05 HISTORY — DX: Palpitations: R00.2

## 2021-05-08 ENCOUNTER — Encounter: Payer: Self-pay | Admitting: Family Medicine

## 2021-05-22 ENCOUNTER — Encounter: Payer: Self-pay | Admitting: Family Medicine

## 2021-05-22 LAB — HM MAMMOGRAPHY

## 2021-05-23 ENCOUNTER — Encounter: Payer: Self-pay | Admitting: Family Medicine

## 2021-05-30 ENCOUNTER — Ambulatory Visit: Payer: Medicare PPO | Admitting: Cardiology

## 2021-05-30 ENCOUNTER — Encounter: Payer: Self-pay | Admitting: Cardiology

## 2021-05-30 ENCOUNTER — Ambulatory Visit (INDEPENDENT_AMBULATORY_CARE_PROVIDER_SITE_OTHER): Payer: Medicare PPO

## 2021-05-30 ENCOUNTER — Other Ambulatory Visit: Payer: Self-pay

## 2021-05-30 VITALS — BP 106/80 | HR 68 | Ht 69.0 in | Wt 233.0 lb

## 2021-05-30 DIAGNOSIS — I493 Ventricular premature depolarization: Secondary | ICD-10-CM | POA: Diagnosis not present

## 2021-05-30 DIAGNOSIS — R5382 Chronic fatigue, unspecified: Secondary | ICD-10-CM

## 2021-05-30 DIAGNOSIS — R0609 Other forms of dyspnea: Secondary | ICD-10-CM | POA: Diagnosis not present

## 2021-05-30 DIAGNOSIS — R0789 Other chest pain: Secondary | ICD-10-CM

## 2021-05-30 DIAGNOSIS — R002 Palpitations: Secondary | ICD-10-CM

## 2021-05-30 DIAGNOSIS — R0602 Shortness of breath: Secondary | ICD-10-CM
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
## 2021-05-30 NOTE — Patient Instructions (Signed)
Medication Instructions:  Your physician recommends that you continue on your current medications as directed. Please refer to the Current Medication list given to you today.   *If you need a refill on your cardiac medications before your next appointment, please call your pharmacy*   Lab Work: None ordered  If you have labs (blood work) drawn today and your tests are completely normal, you will receive your results only by: [place] (if you have MyChart) OR A paper copy in the mail If you have any lab test that is abnormal or we need to change your treatment, we will call you to review the results.   Testing/Procedures:  You are scheduled for a Cardiopulmonary Exercise (CPX) Test @ [facility] on: __ / __ / ___ @ ___:____AM/PM.   Your physician has recommended that you have a cardiopulmonary stress test (CPX). CPX testing is a non-invasive measurement of heart and lung function. This test combines measurements of you ventilation, respiratory gas exchange in the lungs, electrocardiogram (EKG), blood pressure and physical response before, during, and following an exercise protocol  Expect to be in the lab for 2 hours.   Please arrivee @ [place] (main entrance A). Free valet parking is available. You will proceed to admitting 30 minutes prior to your scheduled appointment. You may be asked to rescheduled if you arrive 20 minutes or more after your scheduled appointment time.   After checking in through admitting, proceed to [facility] [place] waiting room.   Instructions for day of the test: - refrain from ingesting a heavy meal, alcohol, or caffeine or using tobacco products within 2 hours of the test - please DO NOT FAST for more than 8 hours - you may have other non-alcoholic, caffeine-free beverages, a light snack (crackers, piece of fruit, toast, bagel, etc) up to your appointment time  - avoid significant exertion or exercise within 24 hours of your  test - be prepared to exercise & sweat - your clothing should permit freedom of movement and include running/walking shoes - women should bring loose-fitting, short-sleeve blouse  - this evaluation may be fatiguing and you may wish to have someone accompany you to the assessment and drive you home afterward  - bring a list of medications with you (including dose, frequency) - take all medications as prescribed unless directed otherwise by your physician    General description of test:  A brief lung function test will be performed. This will involve you taking deep breaths and blowing out hard & fast through your mouth. During these, a nose clip will be on your nose and you will be breathing through a breathing device.   For the exercise portion of the test, you will be walking on a motorized treadmill or riding a stationary bike to your maximal effort or until symptoms such as chest pain, shortness of breath, leg pain or dizziness limit your exercise. Like the lung function test, you will be breathing in and out of a breathing device through your mouth with the nose clip. Your heart rate, ECG, blood pressure, oxygen saturation, breathing rate & depth, amount of oxygen you consume and amount of carbon dioxide you produce will be measured and monitored throughout the test.   If you need to cancel or reschedule your test, call [phone].     2. Your provider has ordered a heart monitor to wear for 7 days. This will be mailed to your home with instructions on placement. Once you have  finished the time frame requested, you will return monitor in box provided.      Follow-Up: At [facility], you and your health needs are our priority.  As part of our continuing mission to provide you with exceptional heart care, we have created designated Provider Care Teams.  These Care Teams include your primary Cardiologist (physician) and Advanced Practice Providers (APPs -  Physician Assistants and Nurse  Practitioners) who all work together to provide you with the care you need, when you need it.  We recommend signing up for the patient portal called "MyChart".  Sign up information is provided on this After Visit Summary.  MyChart is used to connect with patients for Virtual Visits (Telemedicine).  Patients are able to view lab/test results, encounter notes, upcoming appointments, etc.  Non-urgent messages can be sent to your provider as well.   To learn more about what you can do with MyChart, go to [email or web address].    Your next appointment:   2 month(s)  The format for your next appointment:   In Person  Provider:   You may see Glenetta Hew, MD or one of the following Advanced Practice Providers on your designated Care Team:   Murray Hodgkins, NP Christell Faith, PA-C Cadence Kathlen Mody, PA-C:1}    Other Instructions N/A

## 2021-05-31 ENCOUNTER — Encounter: Payer: Self-pay | Admitting: Cardiology

## 2021-05-31 DIAGNOSIS — R0609 Other forms of dyspnea: Secondary | ICD-10-CM | POA: Insufficient documentation

## 2021-05-31 DIAGNOSIS — R0602 Shortness of breath: Secondary | ICD-10-CM | POA: Insufficient documentation

## 2021-05-31 NOTE — Assessment & Plan Note (Signed)
Likely deconditioned, obese.

## 2021-05-31 NOTE — Assessment & Plan Note (Signed)
Dyspnea exertion-probably related to deconditioning, obesity, however since she is also having some chest tightness with or without exertion, would like to clarify.  Plan: Cardiopulmonary Exercise Stress Test-this will differentiate between cardiac, pulmonary or deconditioning as etiology for dyspnea.  Depending on the results, we can consider echocardiogram to assess EF and diastolic function as well as possibility of Coronary Calcium Score +/- Coronary CTA

## 2021-05-31 NOTE — Assessment & Plan Note (Signed)
Assess PVC burden with Zio patch monitor.  7 days.  Depending on extent of PVC burden, may or may not need echocardiogram.

## 2021-05-31 NOTE — Assessment & Plan Note (Signed)
Chest tightness occurring at rest with no true regularity.  I suspect this is probably more musculoskeletal based on some tenderness on palpation.    Can also consider baseline (*evaluation with Coronary Calcium Scoring pending the results of CPX (cardiopulmonary exercise stress test)

## 2021-06-03 DIAGNOSIS — R002 Palpitations: Secondary | ICD-10-CM

## 2021-06-03 DIAGNOSIS — I493 Ventricular premature depolarization: Secondary | ICD-10-CM | POA: Diagnosis not present

## 2021-06-05 ENCOUNTER — Other Ambulatory Visit: Payer: Self-pay

## 2021-06-05 ENCOUNTER — Ambulatory Visit (HOSPITAL_COMMUNITY): Payer: Medicare PPO | Attending: Cardiology

## 2021-06-05 DIAGNOSIS — R0602 Shortness of breath: Secondary | ICD-10-CM | POA: Diagnosis not present

## 2021-06-05 DIAGNOSIS — R0609 Other forms of dyspnea: Secondary | ICD-10-CM | POA: Insufficient documentation

## 2021-06-05 HISTORY — PX: OTHER SURGICAL HISTORY: SHX169

## 2021-06-06 ENCOUNTER — Other Ambulatory Visit: Payer: Self-pay | Admitting: Radiology

## 2021-06-06 DIAGNOSIS — N6313 Unspecified lump in the right breast, lower outer quadrant: Secondary | ICD-10-CM | POA: Diagnosis not present

## 2021-06-06 DIAGNOSIS — N6011 Diffuse cystic mastopathy of right breast: Secondary | ICD-10-CM | POA: Diagnosis not present

## 2021-06-17 DIAGNOSIS — R002 Palpitations: Secondary | ICD-10-CM | POA: Diagnosis not present

## 2021-06-17 DIAGNOSIS — I493 Ventricular premature depolarization: Secondary | ICD-10-CM | POA: Diagnosis not present

## 2021-07-29 ENCOUNTER — Telehealth: Payer: Self-pay | Admitting: Cardiology

## 2021-07-29 NOTE — Telephone Encounter (Signed)
Kathleen Man, MD  ?07/26/2021 10:16 PM EDT   ?  ?Event monitor does have some notable abnormalities 1 of which is the presence of atrial fibrillation and several other short little bursts of fast heart rates. ?We did talk about the details in clinic follow-up this coming week. ?  ?Glenetta Hew, MD  ? ?

## 2021-07-29 NOTE — Telephone Encounter (Signed)
Attempted to call the patient. ?No answer- I left a detailed message of results on her voice mail (ok per DPR).  ?I asked that she please call back with any further questions/ concerns in the interim, but otherwise we will see her as scheduled with Dr. Ellyn Hack this week on 08/01/21 at 10:00 am. ?

## 2021-08-01 ENCOUNTER — Encounter: Payer: Self-pay | Admitting: Cardiology

## 2021-08-01 ENCOUNTER — Ambulatory Visit: Payer: Medicare PPO | Admitting: Cardiology

## 2021-08-01 VITALS — BP 118/70 | HR 70 | Ht 68.5 in | Wt 220.8 lb

## 2021-08-01 DIAGNOSIS — Z6834 Body mass index (BMI) 34.0-34.9, adult: Secondary | ICD-10-CM

## 2021-08-01 DIAGNOSIS — E669 Obesity, unspecified: Secondary | ICD-10-CM | POA: Diagnosis not present

## 2021-08-01 DIAGNOSIS — I4719 Other supraventricular tachycardia: Secondary | ICD-10-CM | POA: Insufficient documentation

## 2021-08-01 DIAGNOSIS — R0602 Shortness of breath: Secondary | ICD-10-CM

## 2021-08-01 DIAGNOSIS — R0789 Other chest pain: Secondary | ICD-10-CM

## 2021-08-01 DIAGNOSIS — I493 Ventricular premature depolarization: Secondary | ICD-10-CM

## 2021-08-01 DIAGNOSIS — I471 Supraventricular tachycardia: Secondary | ICD-10-CM

## 2021-08-01 DIAGNOSIS — I480 Paroxysmal atrial fibrillation: Secondary | ICD-10-CM

## 2021-08-01 DIAGNOSIS — R0609 Other forms of dyspnea: Secondary | ICD-10-CM | POA: Diagnosis not present

## 2021-08-01 MED ORDER — DILTIAZEM HCL ER COATED BEADS 180 MG PO CP24: 180.0000 mg | ORAL_CAPSULE | Freq: Every day | ORAL | 11 refills | Status: AC

## 2021-08-01 MED ORDER — APIXABAN 5 MG PO TABS: 5.0000 mg | ORAL_TABLET | Freq: Two times a day (BID) | ORAL | 11 refills | Status: DC

## 2021-08-01 NOTE — Assessment & Plan Note (Signed)
New diagnosis of atrial fibrillation.  Not overly symptomatic.  It actually more concerning.  Monitor showed 5% burden in the [place] which she really noted was a prolonged spell thankfully.  This is however concerning because not having symptoms of A-fib she is unaware of her burden. ? ?This patients CHA2DS2-VASc Score and unadjusted Ischemic Stroke Rate (% per year) is equal to 2.2 % stroke rate/year from a score of 2 -3 ? ?Above score calculated as 1 point each if present [CHF, HTN, DM, Vascular=MI/PAD/Aortic Plaque, Age if 65-74, or Female] -> also suspect that there is some element of aortic plaque not yet diagnosed. ? ? ?Plan ?? For now we will simply rate control since she is not overly symptomatic. => Start diltiazem 180 mg daily ?? Eliquis 5 mg twice daily ?? We will check a 2D echocardiogram based on the amount of PACs and PVCs noted as well as A-fib. ?? Relatively reassuring results on her CPX.  She did have some chest discomfort but no objective findings to correlate ischemia.  Low threshold however to consider more detailed evaluation with Coronary CTA. ?

## 2021-08-01 NOTE — Assessment & Plan Note (Signed)
Somewhat atypical symptoms.  Relatively reassuring CPX although she did have some discomfort while exercising, objective data does not support this. ? ?Continue to follow symptoms, and with low threshold to consider objective evaluation with Coronary Calcium Score. ?

## 2021-08-01 NOTE — Assessment & Plan Note (Signed)
Probably related to deconditioning and obesity.  This was suggested by the CPX. ? ?Midodrine new diagnosis of A-fib, PAT and PVCs, rechecking 2D echocardiogram. ?

## 2021-08-01 NOTE — Assessment & Plan Note (Signed)
PVC burden was actually less significant than PAC burden, but still occasional PVCs noted.  They were noted during her exercise.  Not overly symptomatic.  Thankfully there is no suggestion of ischemia on the CPX. ? ?Plan: Check 2D echo ?

## 2021-08-01 NOTE — Progress Notes (Signed)
? ? ?Primary Care Provider: Rees Crews, Hart ?[facility] Cardiologist: None ?Electrophysiologist: None ? ?Clinic Note: ?Chief Complaint  ?Patient presents with  ? Follow-up  ?  2 month follow up: Test results  ? Atrial Fibrillation  ?  Noted on monitor.  No significant symptoms  ? ?=================================== ? ?ASSESSMENT/PLAN  ? ?Problem List Items Addressed This Visit   ? ?  ? Cardiology Problems  ? PVC (premature ventricular contraction) (Chronic)  ?  PVC burden was actually less significant than PAC burden, but still occasional PVCs noted.  They were noted during her exercise.  Not overly symptomatic.  Thankfully there is no suggestion of ischemia on the CPX. ? ?Plan: Check 2D echo ?  ?  ? Relevant Medications  ? apixaban (ELIQUIS) 5 MG TABS tablet  ? diltiazem (CARDIZEM CD) 180 MG 24 hr capsule  ? Other Relevant Orders  ? EKG 12-Lead  ? PAF (paroxysmal atrial fibrillation) (HCC) - Primary (Chronic)  ?  New diagnosis of atrial fibrillation.  Not overly symptomatic.  It actually more concerning.  Monitor showed 5% burden in the [place] which she really noted was a prolonged spell thankfully.  This is however concerning because not having symptoms of A-fib she is unaware of her burden. ? ?This patients CHA2DS2-VASc Score and unadjusted Ischemic Stroke Rate (% per year) is equal to 2.2 % stroke rate/year from a score of 2 -3 ? ?Above score calculated as 1 point each if present [CHF, HTN, DM, Vascular=MI/PAD/Aortic Plaque, Age if 65-74, or Female] -> also suspect that there is some element of aortic plaque not yet diagnosed. ? ? ?Plan ?For now we will simply rate control since she is not overly symptomatic. => Start diltiazem 180 mg daily ?Eliquis 5 mg twice daily ?We will check a 2D echocardiogram based on the amount of PACs and PVCs noted as well as A-fib. ?Relatively reassuring results on her CPX.  She did have some chest discomfort but no objective findings to correlate ischemia.  Low  threshold however to consider more detailed evaluation with Coronary CTA. ?  ?  ? Relevant Medications  ? apixaban (ELIQUIS) 5 MG TABS tablet  ? diltiazem (CARDIZEM CD) 180 MG 24 hr capsule  ? Other Relevant Orders  ? EKG 12-Lead  ? ECHOCARDIOGRAM COMPLETE  ? PAT (paroxysmal atrial tachycardia) (HCC) (Chronic)  ?  Short bursts of SVT/PAT also noted on monitor.  She had much more of this that she did A-fib.  It is possible that these short bursts are actually harbingers for future episodes of A-fib. ? ?While I would probably want to use a beta-blocker to treat, to avoid concerns of fatigue and tolerance, we will start with a calcium channel blocker. ? ?Discussed vagal maneuvers. ?  ?  ? Relevant Medications  ? apixaban (ELIQUIS) 5 MG TABS tablet  ? diltiazem (CARDIZEM CD) 180 MG 24 hr capsule  ? Other Relevant Orders  ? EKG 12-Lead  ?  ? Other  ? Class 1 obesity without serious comorbidity with body mass index (BMI) of 34.0 to 34.9 in adult  ? Relevant Orders  ? EKG 12-Lead  ? Chest tightness  ?  Somewhat atypical symptoms.  Relatively reassuring CPX although she did have some discomfort while exercising, objective data does not support this. ? ?Continue to follow symptoms, and with low threshold to consider objective evaluation with Coronary Calcium Score. ?  ?  ? SOB (shortness of breath)  ? Relevant Orders  ? EKG 12-Lead  ?  DOE (dyspnea on exertion)  ?  Probably related to deconditioning and obesity.  This was suggested by the CPX. ? ?Midodrine new diagnosis of A-fib, PAT and PVCs, rechecking 2D echocardiogram. ?  ?  ? ?=================================== ? ?HPI:   ? ?Kathleen Hart " Kathleen Hart Hart" is a 71 y.o. female who is being seen today for the evaluation of IRREGULAR HEARTBEATS at the request of Kathleen Hart Hart. ? ?[place] was seen at [facility] family practice by Kathleen Hart Hart on April 22, 2021 with complaints of irregular heartbeat. ->  Was planning to donate blood that day,  noted to have irregular heart rate indicators fast and then slow "all over the place ".  Told to go to PCP.  She did note occasional palpitations.  Off-and-on chest tightness for the last few weeks.  No other symptoms associated.  Episodes last maybe 30 minutes-usually at rest.  Difficulty losing weight. => Referred to cardiology with "ectopic beats noted on EKG and exam.  Lipids TSH and CBC checked. ? ?[place] was seen and initial Consult 05/30/2020 to evaluate irregular heartbeats.  She had been referred by her PCP after her visit in December with complaints of irregular heartbeat.  Apparently she had gone to donate blood and was told that she had "irregular heartbeats "-beating fast" all over the place ".  She felt some occasional palpitations but nothing significant.  She also had some off-and-on episodes of chest discomfort.  She described a forceful heart pounding.  Sometimes fast but not always just irregular.  Chest comfort only lasted longer palpitation last.  Sometimes up to 30 minutes.  Oftentimes at the end of the day, but would sometimes wake up with them.  Not while active. ?She also noted some exertional dyspnea, and was having difficulty with weight loss.  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  She gained a lot of weight over the last several years and was having a hard time getting it out. ? ?Recent Hospitalizations: None ? ?Reviewed  CV studies:   ? ?The following studies were reviewed today: (if available, images/films reviewed: From Epic Chart or Care Everywhere) ?CPX 06/05/2021: Normal exercise capacity.  Some chest pressure with exercise but no ischemic changes on ECG or O2 pulse curve.  Occasional PVCs with exercise.  No cardiac limitation.  At peak exercise, ventilatory limits were reduced by obesity and restrictive lung physiology. ? ?Zio Patch:Patch Wear Time 7d 19 hr (1/30-06/11/2021) ?Predominant Heart Rhythm Is Sinus Rhythm: Heart rate range 47 to 154 bpm with  an average of 73 bpm. ?Occasional isolated PAC (premature atrial contractions., 2.1%) & PVCs (premature ventricular contractions-1.2%) ?1 run of 8 beats "ventricular tachycardia "with a max rate of 190 bpm ?Atrial fibrillation noted roughly 5% burden see details below). Heart rate range was 85 to 211 bpm with an average of 130 bpm. ?Atrial fibrillation 5% burden: 10 episodes lasting greater than 30 seconds; 5 episodes of 6 minutes or longer, longest duration 3 hours and 49 minutes. ?Fastest A-fib rate ranged by 40 bpm averaging 136 bpm ?Multiple (166) atrial runs: Fastest 7 beats-207 bpm, longest 59 beats (24.6 seconds) ?  ? ?Interval History:  ? ?Kathleen Hart Hart presents today for follow-up to discuss results of her studies.  She still has some intermittent palpitations spells but has not really had any more chest discomfort.  Just she can feel the pounding sensation off and on.  She did not necessarily notice anything lasting a couple hours.  She still has some exertional dyspnea but no real exertional chest pain.  She has had some tachycardia spells that she could notice but they did not seem very long-lasting.  Not associated with syncope or near syncope. ? ?She is happy to announce though that she has lost another 13 pounds with her dietary adjustments and exercise.  She was proud of this accomplishment and does note that her energy level has improved.  Her exertional dyspnea is also improved. ? ?No symptoms to suggest angina or CHF. ? ?CV Review of Symptoms (Summary) ?Cardiovascular ROS: positive for - dyspnea on exertion, irregular heartbeat, palpitations, rapid heart rate, and pounding/forceful heart beats, but not prolonged.  Trivial swelling ?negative for - chest pain, orthopnea, paroxysmal nocturnal dyspnea, shortness of breath, or syncope/near syncope, TIA/amaurosis fugax, claudication ? ?REVIEWED OF SYSTEMS  ? ?Review of Systems  ?Constitutional:  Positive for malaise/fatigue (Still somewhat  deconditioned, sedentary.  Prolonged recovery from knee replacements.Marland Kitchen). Negative for weight loss (gained in last 2 yrs -- decondition).  ?HENT:  Negative for congestion and nosebleeds.   ?Respiratory:  Positive for shor

## 2021-08-01 NOTE — Assessment & Plan Note (Signed)
Short bursts of SVT/PAT also noted on monitor.  She had much more of this that she did A-fib.  It is possible that these short bursts are actually harbingers for future episodes of A-fib. ? ?While I would probably want to use a beta-blocker to treat, to avoid concerns of fatigue and tolerance, we will start with a calcium channel blocker. ? ?Discussed vagal maneuvers. ?

## 2021-08-01 NOTE — Patient Instructions (Signed)
Medication Instructions:  ?- Your physician has recommended you make the following change in your medication:  ? ?1) START Eliquis 5 mg: ?- take 1 tablet by mouth TWICE daily (or every 12 hours)   ? ?2) START Diltiazem 180 mg: ?- take 1 capsule by mouth ONCE daily ? ?Sample Given: ?Eliquiq 5 mg ?Lot: [id number] ?Exp: 05/2023 ?# 2 boxes ? ? ?*If you need a refill on your cardiac medications before your next appointment, please call your pharmacy* ? ? ?Lab Work: ?- none ordered ? ?If you have labs (blood work) drawn today and your tests are completely normal, you will receive your results only by: ?MyChart Message (if you have MyChart) OR ?A paper copy in the mail ?If you have any lab test that is abnormal or we need to change your treatment, we will call you to review the results. ? ? ?Testing/Procedures: ? ?1) Echocardiogram: ?- Your physician has requested that you have an echocardiogram. Echocardiography is a painless test that uses sound waves to create images of your heart. It provides your doctor with information about the size and shape of your heart and how well your heart?s chambers and valves are working. This procedure takes approximately one hour. There are no restrictions for this procedure.There is a possibility that an IV may need to be started during your test to inject an image enhancing agent. This is done to obtain more optimal pictures of your heart. Therefore we ask that you do at least drink some water prior to coming in to hydrate your veins.  ? ? ? ?Follow-Up: ?At [facility], you and your health needs are our priority.  As part of our continuing mission to provide you with exceptional heart care, we have created designated Provider Care Teams.  These Care Teams include your primary Cardiologist (physician) and Advanced Practice Providers (APPs -  Physician Assistants and Nurse Practitioners) who all work together to provide you with the care you need, when you need it. ? ?We recommend signing  up for the patient portal called "MyChart".  Sign up information is provided on this After Visit Summary.  MyChart is used to connect with patients for Virtual Visits (Telemedicine).  Patients are able to view lab/test results, encounter notes, upcoming appointments, etc.  Non-urgent messages can be sent to your provider as well.   ?To learn more about what you can do with MyChart, go to [email or web address].   ? ?Your next appointment:   ?3 month(s) ? ?The format for your next appointment:   ?In Person ? ?Provider:   ?Glenetta Hew, MD  ? ? ?Other Instructions ? ?Atrial Fibrillation ?Atrial fibrillation is a type of heartbeat that is irregular or fast. If you have this condition, your heart beats without any order. This makes it hard for your heart to pump blood in a normal way. ?Atrial fibrillation may come and go, or it may become a long-lasting problem. If this condition is not treated, it can put you at higher risk for stroke, heart failure, and other heart problems. ?What are the causes? ?This condition may be caused by diseases that damage the heart. They include: ?High blood pressure. ?Heart failure. ?Heart valve disease. ?Heart surgery. ?Other causes include: ?Diabetes. ?Thyroid disease. ?Being overweight. ?Kidney disease. ?Sometimes the cause is not known. ?What increases the risk? ?You are more likely to develop this condition if: ?You are older. ?You smoke. ?You exercise often and very hard. ?You have a family history of this condition. ?You are a  man. ?You use drugs. ?You drink a lot of alcohol. ?You have lung conditions, such as emphysema, pneumonia, or COPD. ?You have sleep apnea. ?What are the signs or symptoms? ?Common symptoms of this condition include: ?A feeling that your heart is beating very fast. ?Chest pain or discomfort. ?Feeling short of breath. ?Suddenly feeling light-headed or weak. ?Getting tired easily during activity. ?Fainting. ?Sweating. ?In some cases, there are no symptoms. ?How  is this treated? ?Treatment for this condition depends on underlying conditions and how you feel when you have atrial fibrillation. They include: ?Medicines to: ?Prevent blood clots. ?Treat heart rate or heart rhythm problems. ?Using devices, such as a pacemaker, to correct heart rhythm problems. ?Doing surgery to remove the part of the heart that sends bad signals. ?Closing an area where clots can form in the heart (left atrial appendage). ?In some cases, your doctor will treat other underlying conditions. ?Follow these instructions at home: ?Medicines ?Take over-the-counter and prescription medicines only as told by your doctor. ?Do not take any new medicines without first talking to your doctor. ?If you are taking blood thinners: ?Talk with your doctor before you take any medicines that have aspirin or NSAIDs, such as ibuprofen, in them. ?Take your medicine exactly as told by your doctor. Take it at the same time each day. ?Avoid activities that could hurt or bruise you. Follow instructions about how to prevent falls. ?Wear a bracelet that says you are taking blood thinners. Or, carry a card that lists what medicines you take. ?Lifestyle ?  ?Do not use any products that have nicotine or tobacco in them. These include cigarettes, e-cigarettes, and chewing tobacco. If you need help quitting, ask your doctor. ?Eat heart-healthy foods. Talk with your doctor about the right eating plan for you. ?Exercise regularly as told by your doctor. ?Do not drink alcohol. ?Lose weight if you are overweight. ?Do not use drugs, including cannabis. ?General instructions ?If you have a condition that causes breathing to stop for a short period of time (apnea), treat it as told by your doctor. ?Keep a healthy weight. Do not use diet pills unless your doctor says they are safe for you. Diet pills may make heart problems worse. ?Keep all follow-up visits as told by your doctor. This is important. ?Contact a doctor if: ?You notice a  change in the speed, rhythm, or strength of your heartbeat. ?You are taking a blood-thinning medicine and you get more bruising. ?You get tired more easily when you move or exercise. ?You have a sudden change in weight. ?Get help right away if: ? ?You have pain in your chest or your belly (abdomen). ?You have trouble breathing. ?You have side effects of blood thinners, such as blood in your vomit, poop (stool), or pee (urine), or bleeding that cannot stop. ?You have any signs of a stroke. "BE FAST" is an easy way to remember the main warning signs: ?B - Balance. Signs are dizziness, sudden trouble walking, or loss of balance. ?E - Eyes. Signs are trouble seeing or a change in how you see. ?F - Face. Signs are sudden weakness or loss of feeling in the face, or the face or eyelid drooping on one side. ?A - Arms. Signs are weakness or loss of feeling in an arm. This happens suddenly and usually on one side of the body. ?S - Speech. Signs are sudden trouble speaking, slurred speech, or trouble understanding what people say. ?T - Time. Time to call emergency services. Write down what  time symptoms started. ?You have other signs of a stroke, such as: ?A sudden, very bad headache with no known cause. ?Feeling like you may vomit (nausea). ?Vomiting. ?A seizure. ?These symptoms may be an emergency. Do not wait to see if the symptoms will go away. Get medical help right away. Call your local emergency services (911 in the [place] Do not drive yourself to the hospital. ?Summary ?Atrial fibrillation is a type of heartbeat that is irregular or fast. ?You are at higher risk of this condition if you smoke, are older, have diabetes, or are overweight. ?Follow your doctor's instructions about medicines, diet, exercise, and follow-up visits. ?Get help right away if you have signs or symptoms of a stroke. ?Get help right away if you cannot catch your breath, or you have chest pain or discomfort. ?This information is not intended to  replace advice given to you by your health care provider. Make sure you discuss any questions you have with your health care provider. ?Document Revised: 10/13/2018 Document Reviewed: 10/13/2018 ?Elsevier

## 2021-08-12 ENCOUNTER — Ambulatory Visit (INDEPENDENT_AMBULATORY_CARE_PROVIDER_SITE_OTHER): Payer: Medicare PPO

## 2021-08-12 ENCOUNTER — Ambulatory Visit (INDEPENDENT_AMBULATORY_CARE_PROVIDER_SITE_OTHER): Payer: Medicare PPO | Admitting: Family Medicine

## 2021-08-12 ENCOUNTER — Encounter: Payer: Self-pay | Admitting: Family Medicine

## 2021-08-12 VITALS — BP 128/82 | HR 52 | Temp 95.5°F | Ht 68.0 in | Wt 219.1 lb

## 2021-08-12 VITALS — BP 128/82 | HR 52 | Temp 97.5°F | Resp 16 | Ht 68.0 in | Wt 219.0 lb

## 2021-08-12 DIAGNOSIS — R7303 Prediabetes: Secondary | ICD-10-CM

## 2021-08-12 DIAGNOSIS — M8580 Other specified disorders of bone density and structure, unspecified site: Secondary | ICD-10-CM | POA: Diagnosis not present

## 2021-08-12 DIAGNOSIS — Z6833 Body mass index (BMI) 33.0-33.9, adult: Secondary | ICD-10-CM | POA: Diagnosis not present

## 2021-08-12 DIAGNOSIS — Z0000 Encounter for general adult medical examination without abnormal findings: Secondary | ICD-10-CM

## 2021-08-12 DIAGNOSIS — Z1211 Encounter for screening for malignant neoplasm of colon: Secondary | ICD-10-CM | POA: Diagnosis not present

## 2021-08-12 DIAGNOSIS — Z780 Asymptomatic menopausal state: Secondary | ICD-10-CM | POA: Diagnosis not present

## 2021-08-12 DIAGNOSIS — E669 Obesity, unspecified: Secondary | ICD-10-CM

## 2021-08-12 LAB — POCT GLYCOSYLATED HEMOGLOBIN (HGB A1C): Hemoglobin A1C: 5.4 % (ref 4.0–5.6)

## 2021-08-12 NOTE — Patient Instructions (Addendum)
Kathleen Hart , ?Thank you for taking time to come for your Medicare Wellness Visit. I appreciate your ongoing commitment to your health goals. Please review the following plan we discussed and let me know if I can assist you in the future.  ? ?Screening recommendations/referrals: ?Colonoscopy: 07/23/15, will schedule for the Summer this year ?Mammogram: 05/22/21 ?Bone Density: 07/22/18 ?Recommended yearly ophthalmology/optometry visit for glaucoma screening and checkup ?Recommended yearly dental visit for hygiene and checkup ? ?Vaccinations: ?Influenza vaccine: n/d ?Pneumococcal vaccine: 04/22/19 ?Tdap vaccine: 05/02/15 ?Shingles vaccine: n/d   ?Covid-19:n/d ? ?Advanced directives: yes, requested copy ? ?Conditions/risks identified: none ? ?Next appointment: Follow up in one year for your annual wellness visit - 08/18/22 @ 10am in person ? ? ?Preventive Care 69 Years and Older, Female ?Preventive care refers to lifestyle choices and visits with your health care provider that can promote health and wellness. ?What does preventive care include? ?A yearly physical exam. This is also called an annual well check. ?Dental exams once or twice a year. ?Routine eye exams. Ask your health care provider how often you should have your eyes checked. ?Personal lifestyle choices, including: ?Daily care of your teeth and gums. ?Regular physical activity. ?Eating a healthy diet. ?Avoiding tobacco and drug use. ?Limiting alcohol use. ?Practicing safe sex. ?Taking low-dose aspirin every day. ?Taking vitamin and mineral supplements as recommended by your health care provider. ?What happens during an annual well check? ?The services and screenings done by your health care provider during your annual well check will depend on your age, overall health, lifestyle risk factors, and family history of disease. ?Counseling  ?Your health care provider may ask you questions about your: ?Alcohol use. ?Tobacco use. ?Drug use. ?Emotional  well-being. ?Home and relationship well-being. ?Sexual activity. ?Eating habits. ?History of falls. ?Memory and ability to understand (cognition). ?Work and work Statistician. ?Reproductive health. ?Screening  ?You may have the following tests or measurements: ?Height, weight, and BMI. ?Blood pressure. ?Lipid and cholesterol levels. These may be checked every 5 years, or more frequently if you are over 42 years old. ?Skin check. ?Lung cancer screening. You may have this screening every year starting at age 31 if you have a 30-pack-year history of smoking and currently smoke or have quit within the past 15 years. ?Fecal occult blood test (FOBT) of the stool. You may have this test every year starting at age 58. ?Flexible sigmoidoscopy or colonoscopy. You may have a sigmoidoscopy every 5 years or a colonoscopy every 10 years starting at age 6. ?Hepatitis C blood test. ?Hepatitis B blood test. ?Sexually transmitted disease (STD) testing. ?Diabetes screening. This is done by checking your blood sugar (glucose) after you have not eaten for a while (fasting). You may have this done every 1-3 years. ?Bone density scan. This is done to screen for osteoporosis. You may have this done starting at age 15. ?Mammogram. This may be done every 1-2 years. Talk to your health care provider about how often you should have regular mammograms. ?Talk with your health care provider about your test results, treatment options, and if necessary, the need for more tests. ?Vaccines  ?Your health care provider may recommend certain vaccines, such as: ?Influenza vaccine. This is recommended every year. ?Tetanus, diphtheria, and acellular pertussis (Tdap, Td) vaccine. You may need a Td booster every 10 years. ?Zoster vaccine. You may need this after age 3. ?Pneumococcal 13-valent conjugate (PCV13) vaccine. One dose is recommended after age 34. ?Pneumococcal polysaccharide (PPSV23) vaccine. One dose is recommended after  age 2. ?Talk to your  health care provider about which screenings and vaccines you need and how often you need them. ?This information is not intended to replace advice given to you by your health care provider. Make sure you discuss any questions you have with your health care provider. ?Document Released: 05/18/2015 Document Revised: 01/09/2016 Document Reviewed: 02/20/2015 ?Elsevier Interactive Patient Education ? 2017 [place]. ? ?Fall Prevention in the Home ?Falls can cause injuries. They can happen to people of all ages. There are many things you can do to make your home safe and to help prevent falls. ?What can I do on the outside of my home? ?Regularly fix the edges of walkways and driveways and fix any cracks. ?Remove anything that might make you trip as you walk through a door, such as a raised step or threshold. ?Trim any bushes or trees on the path to your home. ?Use bright outdoor lighting. ?Clear any walking paths of anything that might make someone trip, such as rocks or tools. ?Regularly check to see if handrails are loose or broken. Make sure that both sides of any steps have handrails. ?Any raised decks and porches should have guardrails on the edges. ?Have any leaves, snow, or ice cleared regularly. ?Use sand or salt on walking paths during winter. ?Clean up any spills in your garage right away. This includes oil or grease spills. ?What can I do in the bathroom? ?Use night lights. ?Install grab bars by the toilet and in the tub and shower. Do not use towel bars as grab bars. ?Use non-skid mats or decals in the tub or shower. ?If you need to sit down in the shower, use a plastic, non-slip stool. ?Keep the floor dry. Clean up any water that spills on the floor as soon as it happens. ?Remove soap buildup in the tub or shower regularly. ?Attach bath mats securely with double-sided non-slip rug tape. ?Do not have throw rugs and other things on the floor that can make you trip. ?What can I do in the bedroom? ?Use night  lights. ?Make sure that you have a light by your bed that is easy to reach. ?Do not use any sheets or blankets that are too big for your bed. They should not hang down onto the floor. ?Have a firm chair that has side arms. You can use this for support while you get dressed. ?Do not have throw rugs and other things on the floor that can make you trip. ?What can I do in the kitchen? ?Clean up any spills right away. ?Avoid walking on wet floors. ?Keep items that you use a lot in easy-to-reach places. ?If you need to reach something above you, use a strong step stool that has a grab bar. ?Keep electrical cords out of the way. ?Do not use floor polish or wax that makes floors slippery. If you must use wax, use non-skid floor wax. ?Do not have throw rugs and other things on the floor that can make you trip. ?What can I do with my stairs? ?Do not leave any items on the stairs. ?Make sure that there are handrails on both sides of the stairs and use them. Fix handrails that are broken or loose. Make sure that handrails are as long as the stairways. ?Check any carpeting to make sure that it is firmly attached to the stairs. Fix any carpet that is loose or worn. ?Avoid having throw rugs at the top or bottom of the stairs. If you do  have throw rugs, attach them to the floor with carpet tape. ?Make sure that you have a light switch at the top of the stairs and the bottom of the stairs. If you do not have them, ask someone to add them for you. ?What else can I do to help prevent falls? ?Wear shoes that: ?Do not have high heels. ?Have rubber bottoms. ?Are comfortable and fit you well. ?Are closed at the toe. Do not wear sandals. ?If you use a stepladder: ?Make sure that it is fully opened. Do not climb a closed stepladder. ?Make sure that both sides of the stepladder are locked into place. ?Ask someone to hold it for you, if possible. ?Clearly mark and make sure that you can see: ?Any grab bars or handrails. ?First and last  steps. ?Where the edge of each step is. ?Use tools that help you move around (mobility aids) if they are needed. These include: ?Canes. ?Walkers. ?Scooters. ?Crutches. ?Turn on the lights when you go into a dark area. Replace

## 2021-08-12 NOTE — Assessment & Plan Note (Signed)
Continue regular exercise, Ca, Vit D ?Recheck DEXA ?

## 2021-08-12 NOTE — Assessment & Plan Note (Signed)
Discussed importance of healthy weight management Discussed diet and exercise  
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
## 2021-08-12 NOTE — Assessment & Plan Note (Signed)
Recommend low carb diet °Recheck A1c  °

## 2021-08-13 ENCOUNTER — Other Ambulatory Visit: Payer: Self-pay

## 2021-08-13 ENCOUNTER — Telehealth: Payer: Self-pay | Admitting: Cardiology

## 2021-08-13 DIAGNOSIS — Z1211 Encounter for screening for malignant neoplasm of colon: Secondary | ICD-10-CM

## 2021-08-13 MED ORDER — PEG 3350-KCL-NA BICARB-NACL 420 G PO SOLR: 4000.0000 mL | Freq: Once | ORAL | 0 refills | Status: AC

## 2021-08-13 NOTE — Telephone Encounter (Signed)
Patient with diagnosis of afib on Eliquis for anticoagulation.   ? ?Procedure: Colonoscopy ?Date of procedure: 10/03/21 ? ?CHA2DS2-VASc Score = 2  ?This indicates a 2.2% annual risk of stroke. ?The patient's score is based upon: ?CHF History: 0 ?HTN History: 0 ?Diabetes History: 0 ?Stroke History: 0 ?Vascular Disease History: 0 ?Age Score: 1 ?Gender Score: 1 ? ?CrCl 76 mL/min ?Platelet count 249K ? ?Per office protocol, patient can hold Eliquis for 1-2 days prior to procedure.  ? ?

## 2021-08-13 NOTE — Progress Notes (Signed)
Gastroenterology Pre-Procedure Review ? ?Request Date: 10/03/2021 ?Requesting Physician: Dr. Marius Ditch ? ? ?PATIENT REVIEW QUESTIONS: The patient responded to the following health history questions as indicated:   ? ?1. Are you having any GI issues? no ?2. Do you have a personal history of Polyps? yes (last colonoscopy) ?3. Do you have a family history of Colon Cancer or Polyps? no ?4. Diabetes Mellitus? no ?5. Joint replacements in the past 12 months?no ?6. Major health problems in the past 3 months?yes (afib) ?7. Any artificial heart valves, MVP, or defibrillator?no ?   ?MEDICATIONS & ALLERGIES:    ?Patient reports the following regarding taking any anticoagulation/antiplatelet therapy:   ?Plavix, Coumadin, Eliquis, Xarelto, Lovenox, Pradaxa, Brilinta, or Effient? yes (eliquis) ?Aspirin? no ? ?Patient confirms/reports the following medications:  ?Current Outpatient Medications  ?Medication Sig Dispense Refill  ? apixaban (ELIQUIS) 5 MG TABS tablet Take 1 tablet (5 mg total) by mouth 2 (two) times daily. 60 tablet 11  ? CALCIUM MAGNESIUM ZINC PO Take by mouth. Takes 1 tablet PO 3-4 days per week    ? Cholecalciferol (VITAMIN D3) 50 MCG (2000 UT) capsule Take 4,000 Units by mouth daily.    ? diltiazem (CARDIZEM CD) 180 MG 24 hr capsule Take 1 capsule (180 mg total) by mouth daily. 30 capsule 11  ? fluticasone (FLONASE) 50 MCG/ACT nasal spray Place 2 sprays into both nostrils daily as needed for allergies or rhinitis.    ? Multiple Vitamin (MULTIVITAMIN) tablet Take 1 tablet by mouth. 3-4 days per week    ? omeprazole (PRILOSEC OTC) 20 MG tablet Take 1 tablet (20 mg total) by mouth daily as needed (for acid reflux). 90 tablet 3  ? ?No current facility-administered medications for this visit.  ? ? ?Patient confirms/reports the following allergies:  ?Allergies  ?Allergen Reactions  ? Sulfa Antibiotics Nausea Only  ? ? ?No orders of the defined types were placed in this encounter. ? ? ?AUTHORIZATION INFORMATION ?Primary  Insurance: ?1D#: ?Group #: ? ?Secondary Insurance: ?1D#: ?Group #: ? ?SCHEDULE INFORMATION: ?Date: 10/03/2021 ?Time: ?Location: ?[facility] ?

## 2021-08-13 NOTE — Telephone Encounter (Signed)
? ?  Pre-operative Risk Assessment  ?  ?Patient Name: Kathleen Hart  ?DOB: 1950/05/28 ?MRN: [id number]  ? ?  ? ?Request for Surgical Clearance   ? ?Procedure:  Colonoscopy ? ?Date of Surgery:  Clearance 10/03/21                              ?   ?Surgeon:  not listed ?Surgeon's Group or Practice Name:  [facility] ?Phone number:  [phone] ?Fax number:  [phone] ?  ?Type of Clearance Requested:  Pharmacy, Eliquis ? ?  ?Type of Anesthesia:  Not Indicated ?  ?Additional requests/questions:   ? ?Signed, ?Pilar A Ham   ?08/13/2021, 12:12 PM   ?

## 2021-08-14 ENCOUNTER — Ambulatory Visit (INDEPENDENT_AMBULATORY_CARE_PROVIDER_SITE_OTHER): Payer: Medicare PPO

## 2021-08-14 DIAGNOSIS — I480 Paroxysmal atrial fibrillation: Secondary | ICD-10-CM

## 2021-08-14 HISTORY — PX: TRANSTHORACIC ECHOCARDIOGRAM: SHX275

## 2021-08-15 LAB — ECHOCARDIOGRAM COMPLETE
AR max vel: 2.99 cm2
AV Area VTI: 2.99 cm2
AV Area mean vel: 2.7 cm2
AV Mean grad: 3 mmHg
AV Peak grad: 5.3 mmHg
AV Vena cont: 0.5 cm
Ao pk vel: 1.15 m/s
Area-P 1/2: 4.04 cm2
Calc EF: 61.4 %
S' Lateral: 3.7 cm
Single Plane A2C EF: 69.4 %
Single Plane A4C EF: 55.4 %

## 2021-08-20 ENCOUNTER — Telehealth: Payer: Self-pay | Admitting: Cardiology

## 2021-08-20 ENCOUNTER — Telehealth: Payer: Self-pay

## 2021-08-20 NOTE — Telephone Encounter (Signed)
Called patient to let her know she can hold her eliquis for 48 hours and restart after procedure clearance is in chart  ?

## 2021-08-20 NOTE — Telephone Encounter (Signed)
Attempted to call the patient with results. ?No answer- I left a detailed message on her voice mail of results (ok per DPR). ?  ?I asked that she call back with any further questions/ concerns.  ?

## 2021-08-20 NOTE — Telephone Encounter (Signed)
Kathleen Man, MD  ?08/17/2021  2:00 PM EDT   ?  ?Echocardiogram results shows normal pump function with ejection fraction of 55 to 60%.  Normal range is 50-70.  No wall motion abnormalities to suggest prior injury.  Normal for age grade 1 diastolic dysfunction.  Pretty normal valves.  Interestingly, there appears to be somewhat elevated right-sided pressures, however the right ventricle and right atrium appear normal. ?  ?  ?Glenetta Hew, MD ?.  ? ?

## 2021-10-02 ENCOUNTER — Telehealth: Payer: Self-pay

## 2021-10-02 ENCOUNTER — Telehealth: Payer: Medicare PPO | Admitting: Physician Assistant

## 2021-10-02 DIAGNOSIS — H1033 Unspecified acute conjunctivitis, bilateral: Secondary | ICD-10-CM

## 2021-10-02 MED ORDER — POLYMYXIN B-TRIMETHOPRIM 10000-0.1 UNIT/ML-% OP SOLN: OPHTHALMIC | 0 refills | Status: DC

## 2021-10-02 NOTE — Patient Instructions (Addendum)
Kathleen Hart, thank you for joining Leeanne Rio, PA-C for today's virtual visit.  While this provider is not your primary care provider (PCP), if your PCP is located in our provider database this encounter information will be shared with them immediately following your visit.  Consent: (Patient) Kathleen Hart provided verbal consent for this virtual visit at the beginning of the encounter.  Current Medications:  Current Outpatient Medications:    apixaban (ELIQUIS) 5 MG TABS tablet, Take 1 tablet (5 mg total) by mouth 2 (two) times daily., Disp: 60 tablet, Rfl: 11   CALCIUM MAGNESIUM ZINC PO, Take by mouth. Takes 1 tablet PO 3-4 days per week, Disp: , Rfl:    Cholecalciferol (VITAMIN D3) 50 MCG (2000 UT) capsule, Take 4,000 Units by mouth daily., Disp: , Rfl:    diltiazem (CARDIZEM CD) 180 MG 24 hr capsule, Take 1 capsule (180 mg total) by mouth daily., Disp: 30 capsule, Rfl: 11   fluticasone (FLONASE) 50 MCG/ACT nasal spray, Place 2 sprays into both nostrils daily as needed for allergies or rhinitis., Disp: , Rfl:    Multiple Vitamin (MULTIVITAMIN) tablet, Take 1 tablet by mouth. 3-4 days per week, Disp: , Rfl:    omeprazole (PRILOSEC OTC) 20 MG tablet, Take 1 tablet (20 mg total) by mouth daily as needed (for acid reflux)., Disp: 90 tablet, Rfl: 3   Medications ordered in this encounter:  No orders of the defined types were placed in this encounter.    *If you need refills on other medications prior to your next appointment, please contact your pharmacy*  Follow-Up: Call back or seek an in-person evaluation if the symptoms worsen or if the condition fails to improve as anticipated.  Other Instructions Please keep hands washed and avoid touching the eyes.  Apply warm compresses to the eyes for 10-15 minutes, a few times per day.  Use the Polytrim as directed.   If you note any new or worsening symptoms despite treatment, please seek an in-person evaluation.    Bacterial Conjunctivitis, Adult Bacterial conjunctivitis is an infection of your conjunctiva. This is the clear membrane that covers the white part of your eye and the inner part of your eyelid. This infection can make your eye: Red or pink. Itchy or irritated. This condition spreads easily from person to person (is contagious) and from one eye to the other eye. What are the causes? This condition is caused by germs (bacteria). You may get the infection if you come into close contact with: A person who has the infection. Items that have germs on them (are contaminated), such as face towels, contact lens solution, or eye makeup. What increases the risk? You are more likely to get this condition if: You have contact with people who have the infection. You wear contact lenses. You have a sinus infection. You have had a recent eye injury or surgery. You have a weak body defense system (immune system). You have dry eyes. What are the signs or symptoms?  Thick, yellowish discharge from the eye. Tearing or watery eyes. Itchy eyes. Burning feeling in your eyes. Eye redness. Swollen eyelids. Blurred vision. How is this treated?  Antibiotic eye drops or ointment. Antibiotic medicine taken by mouth. This is used for infections that do not get better with drops or ointment or that last more than 10 days. Cool, wet cloths placed on the eyes. Artificial tears used 2-6 times a day. Follow these instructions at home: Medicines Take or apply your antibiotic  medicine as told by your doctor. Do not stop using it even if you start to feel better. Take or apply over-the-counter and prescription medicines only as told by your doctor. Do not touch your eyelid with the eye-drop bottle or the ointment tube. Managing discomfort Wipe any fluid from your eye with a warm, wet washcloth or a cotton ball. Place a clean, cool, wet cloth on your eye. Do this for 10-20 minutes, 3-4 times a day. General  instructions Do not wear contacts until the infection is gone. Wear glasses until your doctor says it is okay to wear contacts again. Do not wear eye makeup until the infection is gone. Throw away old eye makeup. Change or wash your pillowcase every day. Do not share towels or washcloths. Wash your hands often with soap and water for at least 20 seconds and especially before touching your face or eyes. Use paper towels to dry your hands. Do not touch or rub your eyes. Do not drive or use heavy machinery if your vision is blurred. Contact a doctor if: You have a fever. You do not get better after 10 days. Get help right away if: You have a fever and your symptoms get worse all of a sudden. You have very bad pain when you move your eye. Your face: Hurts. Is red. Is swollen. You have sudden loss of vision. Summary Bacterial conjunctivitis is an infection of your conjunctiva. This infection spreads easily from person to person. Wash your hands often with soap and water for at least 20 seconds and especially before touching your face or eyes. Use paper towels to dry your hands. Take or apply your antibiotic medicine as told by your doctor. Contact a doctor if you have a fever or you do not get better after 10 days. This information is not intended to replace advice given to you by your health care provider. Make sure you discuss any questions you have with your health care provider. Document Revised: 08/01/2020 Document Reviewed: 08/01/2020 Elsevier Patient Education  [place].    If you have been instructed to have an in-person evaluation today at a local Urgent Care facility, please use the link below. It will take you to a list of all of our available [facility], including address, phone number and hours of operation. Please do not delay care.  [facility]  If you or a family member do not have a primary care provider, use the link below to  schedule a visit and establish care. When you choose a [facility] primary care physician or advanced practice provider, you gain a long-term partner in health. Find a Primary Care Provider  Learn more about [facility]'s in-office and virtual care options: [facility]

## 2021-10-02 NOTE — Telephone Encounter (Signed)
Patients call was returned.  She contacted pink eye from her grandchildren and needed to reschedule her tomorrows colonoscopy.  Trish in Endo has been informed to reschedule her to 10/23/21 with Dr. Marius Ditch still.  Thanks, [place], [place]

## 2021-10-02 NOTE — Progress Notes (Signed)
Virtual Visit Consent   Kathleen Hart, you are scheduled for a virtual visit with a [facility] provider today. Just as with appointments in the office, your consent must be obtained to participate. Your consent will be active for this visit and any virtual visit you may have with one of our providers in the next 365 days. If you have a MyChart account, a copy of this consent can be sent to you electronically.  As this is a virtual visit, video technology does not allow for your provider to perform a traditional examination. This may limit your provider's ability to fully assess your condition. If your provider identifies any concerns that need to be evaluated in person or the need to arrange testing (such as labs, EKG, etc.), we will make arrangements to do so. Although advances in technology are sophisticated, we cannot ensure that it will always work on either your end or our end. If the connection with a video visit is poor, the visit may have to be switched to a telephone visit. With either a video or telephone visit, we are not always able to ensure that we have a secure connection.  By engaging in this virtual visit, you consent to the provision of healthcare and authorize for your insurance to be billed (if applicable) for the services provided during this visit. Depending on your insurance coverage, you may receive a charge related to this service.  I need to obtain your verbal consent now. Are you willing to proceed with your visit today? Kathleen Hart has provided verbal consent on 10/02/2021 for a virtual visit (video or telephone). Leeanne Rio, Kathleen  Date: 10/02/2021 12:18 PM  Virtual Visit via Video Note   I, Leeanne Rio, connected with  Kathleen Hart  ([id number], 10-16-1950) on 10/02/21 at 12:30 PM EDT by a video-enabled telemedicine application and verified that I am speaking with the correct person using two identifiers.  Location: Patient: Virtual Visit  Location Patient: Home Provider: Virtual Visit Location Provider: Home Office   I discussed the limitations of evaluation and management by telemedicine and the availability of in person appointments. The patient expressed understanding and agreed to proceed.    History of Present Illness: Kathleen Hart is a 71 y.o. who identifies as a female who was assigned female at birth, and is being seen today for possible conjunctivitis. Notes recently taking care of her grandson with pink eye. Yesterday she started with L eye redness and irritation. Now bilateral with some drainage. Denies vision change. Denies fever, chills, aches.   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 Takes 1 tablet PO 3-4 days per week, Disp: , Rfl:    Cholecalciferol (VITAMIN D3) 50 MCG (2000 UT) capsule, Take 4,000 Units by mouth daily., Disp: , Rfl:    diltiazem (CARDIZEM CD) 180 MG 24 hr  capsule, Take 1 capsule (180 mg total) by mouth daily., Disp: 30 capsule, Rfl: 11   fluticasone (FLONASE) 50 MCG/ACT nasal spray, Place 2 sprays into both nostrils daily as needed for allergies or rhinitis., Disp: , Rfl:    Multiple Vitamin (MULTIVITAMIN) tablet, Take 1 tablet by mouth. 3-4 days per week, Disp: , Rfl:    omeprazole (PRILOSEC OTC) 20 MG tablet, Take 1 tablet (20 mg total) by mouth daily as needed (for acid reflux)., Disp: 90 tablet, Rfl: 3  Observations/Objective: Patient is well-developed, well-nourished in no acute distress.  Resting comfortably at home.  Head is normocephalic, atraumatic.  No labored breathing. Speech is clear and coherent with logical content.  Patient is alert and oriented at baseline.  Bilateral conjunctival injection noted without hemorrhage. Some mild drainage noted without swelling of eyelids.   Assessment and Plan: 1. Acute bacterial conjunctivitis of both eyes - trimethoprim-polymyxin b (POLYTRIM) ophthalmic solution; Apply 1-2 drops into affected eye QID x 5 days.  Dispense: 10 mL; Refill: 0  Start Polytrim OP. Supportive measures and OTC medications reviewed. She does not wear contact lenses. Strict in-person follow-up precautions reviewed.   Follow Up Instructions: I discussed the assessment and treatment plan with the patient. The patient was provided an opportunity to ask questions and all were answered. The patient agreed with the plan and demonstrated an understanding of the instructions.  A copy of instructions were sent to the patient via MyChart unless otherwise noted below.   The patient was advised to call back or seek an in-person evaluation if the symptoms worsen or if the condition fails to improve as anticipated.  Time:  I spent 10 minutes with the patient via telehealth technology discussing the above problems/concerns.    Leeanne Rio, PA-C

## 2021-10-15 ENCOUNTER — Ambulatory Visit
Admission: RE | Admit: 2021-10-15 | Discharge: 2021-10-15 | Disposition: A | Discharge status: Home / Self Care | Payer: Medicare PPO | Source: Ambulatory Visit | Attending: Family Medicine | Admitting: Family Medicine

## 2021-10-15 DIAGNOSIS — M8589 Other specified disorders of bone density and structure, multiple sites: Secondary | ICD-10-CM | POA: Diagnosis not present

## 2021-10-15 DIAGNOSIS — Z780 Asymptomatic menopausal state: Secondary | ICD-10-CM | POA: Insufficient documentation

## 2021-10-15 DIAGNOSIS — M8580 Other specified disorders of bone density and structure, unspecified site: Secondary | ICD-10-CM | POA: Insufficient documentation

## 2021-10-23 ENCOUNTER — Encounter: Payer: Self-pay | Admitting: Gastroenterology

## 2021-10-23 ENCOUNTER — Ambulatory Visit: Payer: Medicare PPO | Admitting: Anesthesiology

## 2021-10-23 ENCOUNTER — Encounter
Admission: RE | Disposition: A | Discharge status: Home / Self Care | Payer: Self-pay | Source: Ambulatory Visit | Attending: Gastroenterology

## 2021-10-23 ENCOUNTER — Ambulatory Visit
Admission: RE | Admit: 2021-10-23 | Discharge: 2021-10-23 | Disposition: A | Discharge status: Home / Self Care | Payer: Medicare PPO | Source: Ambulatory Visit | Attending: Gastroenterology | Admitting: Gastroenterology

## 2021-10-23 DIAGNOSIS — D122 Benign neoplasm of ascending colon: Secondary | ICD-10-CM | POA: Insufficient documentation

## 2021-10-23 DIAGNOSIS — Z1211 Encounter for screening for malignant neoplasm of colon: Secondary | ICD-10-CM | POA: Diagnosis not present

## 2021-10-23 DIAGNOSIS — Z86010 Personal history of colonic polyps: Secondary | ICD-10-CM

## 2021-10-23 DIAGNOSIS — I4891 Unspecified atrial fibrillation: Secondary | ICD-10-CM | POA: Diagnosis not present

## 2021-10-23 DIAGNOSIS — D120 Benign neoplasm of cecum: Secondary | ICD-10-CM | POA: Diagnosis not present

## 2021-10-23 DIAGNOSIS — D124 Benign neoplasm of descending colon: Secondary | ICD-10-CM | POA: Insufficient documentation

## 2021-10-23 DIAGNOSIS — K5730 Diverticulosis of large intestine without perforation or abscess without bleeding: Secondary | ICD-10-CM | POA: Diagnosis not present

## 2021-10-23 DIAGNOSIS — K635 Polyp of colon: Secondary | ICD-10-CM

## 2021-10-23 SURGERY — COLONOSCOPY WITH PROPOFOL
Anesthesia: General

## 2021-10-23 MED ORDER — SODIUM CHLORIDE 0.9 % IV SOLN
INTRAVENOUS | Status: DC
  Administered 2021-10-23: 1000 mL via INTRAVENOUS

## 2021-10-23 MED ORDER — FENTANYL CITRATE (PF) 100 MCG/2ML IJ SOLN
INTRAMUSCULAR | Status: DC | PRN
  Administered 2021-10-23 (×4): 25 ug via INTRAVENOUS

## 2021-10-23 MED ORDER — LIDOCAINE HCL (PF) 2 % IJ SOLN
INTRAMUSCULAR | Status: AC
  Filled 2021-10-23: qty 5

## 2021-10-23 MED ORDER — LIDOCAINE HCL (CARDIAC) PF 100 MG/5ML IV SOSY
PREFILLED_SYRINGE | INTRAVENOUS | Status: DC | PRN
  Administered 2021-10-23: 60 mg via INTRAVENOUS

## 2021-10-23 MED ORDER — PROPOFOL 500 MG/50ML IV EMUL
INTRAVENOUS | Status: DC | PRN
  Administered 2021-10-23: 75 ug/kg/min via INTRAVENOUS

## 2021-10-23 MED ORDER — PROPOFOL 10 MG/ML IV BOLUS
INTRAVENOUS | Status: DC | PRN
  Administered 2021-10-23: 50 mg via INTRAVENOUS

## 2021-10-23 MED ORDER — FENTANYL CITRATE (PF) 100 MCG/2ML IJ SOLN
INTRAMUSCULAR | Status: AC
  Filled 2021-10-23: qty 2
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
## 2021-10-23 NOTE — Op Note (Signed)
[facility] Gastroenterology Patient Name: Kathleen Hart Procedure Date: 10/23/2021 9:29 AM MRN: [id number] Account #: [id number] Date of Birth: 08/06/1950 Admit Type: Outpatient Age: 71 Room: [facility] ENDO ROOM 4 Gender: Female Note Status: Finalized Instrument Name: Jasper Riling [id number] Procedure:             Colonoscopy Indications:           Surveillance: Personal history of adenomatous polyps                         on last colonoscopy 5 years ago, Last colonoscopy:                         March 2017 Providers:             Lin Landsman MD, MD Referring MD:          Dionne Bucy. Bacigalupo (Referring MD) Medicines:             General Anesthesia Complications:         No immediate complications. Estimated blood loss: None. Procedure:             Pre-Anesthesia Assessment:                        - Prior to the procedure, a History and Physical was                         performed, and patient medications and allergies were                         reviewed. The patient is competent. The risks and                         benefits of the procedure and the sedation options and                         risks were discussed with the patient. All questions                         were answered and informed consent was obtained.                         Patient identification and proposed procedure were                         verified by the physician, the nurse, the                         anesthesiologist, the anesthetist and the technician                         in the pre-procedure area in the procedure room in the                         endoscopy suite. Mental Status Examination: alert and                         oriented. Airway Examination: normal oropharyngeal  airway and neck mobility. Respiratory Examination:                         clear to auscultation. CV Examination: normal.                         Prophylactic Antibiotics: The  patient does not require                         prophylactic antibiotics. Prior Anticoagulants: The                         patient has taken Eliquis (apixaban), last dose was 4                         days prior to procedure. ASA Grade Assessment: III - A                         patient with severe systemic disease. After reviewing                         the risks and benefits, the patient was deemed in                         satisfactory condition to undergo the procedure. The                         anesthesia plan was to use general anesthesia.                         Immediately prior to administration of medications,                         the patient was re-assessed for adequacy to receive                         sedatives. The heart rate, respiratory rate, oxygen                         saturations, blood pressure, adequacy of pulmonary                         ventilation, and response to care were monitored                         throughout the procedure. The physical status of the                         patient was re-assessed after the procedure.                        After obtaining informed consent, the colonoscope was                         passed under direct vision. Throughout the procedure,                         the patient's blood pressure, pulse, and oxygen  saturations were monitored continuously. The                         Colonoscope was introduced through the anus and                         advanced to the the cecum, identified by appendiceal                         orifice and ileocecal valve. The colonoscopy was                         performed without difficulty. The patient tolerated                         the procedure well. The quality of the bowel                         preparation was adequate to identify polyps 6 mm and                         larger in size. Findings:      The perianal and digital rectal examinations  were normal. Pertinent       negatives include normal sphincter tone and no palpable rectal lesions.      Five sessile polyps were found in the descending colon, ascending colon       and cecum. The polyps were 3 to 5 mm in size. These polyps were removed       with a cold snare. Resection and retrieval were complete. Estimated       blood loss: none.      The retroflexed view of the distal rectum and anal verge was normal and       showed no anal or rectal abnormalities. Impression:            - Five 3 to 5 mm polyps in the descending colon, in                         the ascending colon and in the cecum, removed with a                         cold snare. Resected and retrieved.                        - The distal rectum and anal verge are normal on                         retroflexion view. Recommendation:        - Discharge patient to home (with escort).                        - Resume previous diet today.                        - Continue present medications.                        - Await pathology results.                        -  Repeat colonoscopy in 3 years for surveillance of                         multiple polyps. Procedure Code(s):     --- Professional ---                        [phone], Colonoscopy, flexible; with removal of                         tumor(s), polyp(s), or other lesion(s) by snare                         technique Diagnosis Code(s):     --- Professional ---                        Z86.010, Personal history of colonic polyps                        K63.5, Polyp of colon CPT copyright 2019 [employer]. All rights reserved. The codes documented in this report are preliminary and upon coder review may  be revised to meet current compliance requirements. Dr. Ulyess Mort Lin Landsman MD, MD 10/23/2021 10:03:24 AM This report has been signed electronically. Number of Addenda: 0 Note Initiated On: 10/23/2021 9:29 AM Scope Withdrawal Time: 0  hours 19 minutes 40 seconds  Total Procedure Duration: 0 hours 24 minutes 5 seconds  Estimated Blood Loss:  Estimated blood loss: none.      [facility]

## 2021-10-23 NOTE — Anesthesia Postprocedure Evaluation (Signed)
Anesthesia Post Note  Patient: Kathleen Hart  Procedure(s) Performed: COLONOSCOPY WITH PROPOFOL  Patient location during evaluation: PACU Anesthesia Type: General Level of consciousness: awake and alert Pain management: pain level controlled Vital Signs Assessment: post-procedure vital signs reviewed and stable Respiratory status: spontaneous breathing, nonlabored ventilation, respiratory function stable and patient connected to nasal cannula oxygen Cardiovascular status: blood pressure returned to baseline and stable Postop Assessment: no apparent nausea or vomiting Anesthetic complications: no   No notable events documented.   Last Vitals:  Vitals:   10/23/21 0903 10/23/21 1004  BP: 113/84 101/66  Pulse: (!) 56 66  Resp: 16 18  Temp: (!) 36.4 C (!) 36.2 C  SpO2: 99% 100%    Last Pain:  Vitals:   10/23/21 1004  TempSrc: Temporal  PainSc: 0-No pain                 Molli Barrows

## 2021-10-23 NOTE — Anesthesia Preprocedure Evaluation (Signed)
Anesthesia Evaluation  Patient identified by MRN, date of birth, ID band Patient awake    Reviewed: Allergy & Precautions, H&P , NPO status , Patient's Chart, lab work & pertinent test results, reviewed documented beta blocker date and time   Airway Mallampati: II   Neck ROM: full    Dental  (+) Poor Dentition   Pulmonary neg pulmonary ROS,    Pulmonary exam normal        Cardiovascular Exercise Tolerance: Good Normal cardiovascular exam+ Valvular Problems/Murmurs  Rhythm:regular Rate:Normal     Neuro/Psych negative neurological ROS  negative psych ROS   GI/Hepatic Neg liver ROS, GERD  Medicated,  Endo/Other  negative endocrine ROS  Renal/GU negative Renal ROS  negative genitourinary   Musculoskeletal   Abdominal   Peds  Hematology negative hematology ROS (+)   Anesthesia Other Findings Past Medical History: No date: Chronic back pain No date: Colon polyps No date: GERD (gastroesophageal reflux disease) No date: Heart murmur 05/2021: Heart palpitations     Comment:  Zio patch monitor: Combination of PACs (2.1%) and PVCs               (1.2%) as well as short bursts of PAT (166 runs with of               7-59 beats with a fast heart rate documented of 207 bpm X              7 BEATS: No date: Osteoarthritis 05/2020: Paroxysmal atrial fibrillation (HCC)     Comment:  Zio patch monitor showed  . Atrial fibrillation 5%               burden: 10 episodes lasting greater than 30 seconds; 5               episodes of 6 minutes or longer, longest duration 3 hours              and 49 minutes.  Fastest rate roughly 136 bpm. Past Surgical History: No date: ABDOMINOPLASTY No date: AUGMENTATION MAMMAPLASTY No date: BREAST BIOPSY 06/05/2021: CARDIOPULMONARY STRESS TEST     Comment:  Normal exercise capacity.  Some chest pressure with               exercise but no ischemic changes on ECG or O2 pulse               curve.   Occasional PVCs with exercise.  No cardiac               limitation.  At peak exercise, ventilatory limits were               reduced by obesity and restrictive lung physiology. No date: COLONOSCOPY 07/23/2015: COLONOSCOPY WITH PROPOFOL; N/A     Comment:  Procedure: COLONOSCOPY WITH PROPOFOL;  Surgeon: Lollie Sails, MD;  Location: [facility] ENDOSCOPY;  Service:               Endoscopy;  Laterality: N/A; No date: DILATION AND CURETTAGE OF UTERUS No date: DILATION AND CURETTAGE, DIAGNOSTIC / THERAPEUTIC No date: JOINT REPLACEMENT 03/09/2018: TOTAL KNEE ARTHROPLASTY; Right     Comment:  Procedure: TOTAL KNEE ARTHROPLASTY;  Surgeon: Corky Mull, MD;  Location: [facility] ORS;  Service: Orthopedics;  Laterality: Right; BMI    Body Mass Index: 32.47 kg/m     Reproductive/Obstetrics negative OB ROS                             Anesthesia Physical Anesthesia Plan  ASA: 3  Anesthesia Plan: General   Post-op Pain Management:    Induction:   PONV Risk Score and Plan:   Airway Management Planned:   Additional Equipment:   Intra-op Plan:   Post-operative Plan:   Informed Consent: I have reviewed the patients History and Physical, chart, labs and discussed the procedure including the risks, benefits and alternatives for the proposed anesthesia with the patient or authorized representative who has indicated his/her understanding and acceptance.     Dental Advisory Given  Plan Discussed with: CRNA  Anesthesia Plan Comments:         Anesthesia Quick Evaluation

## 2021-10-23 NOTE — Transfer of Care (Signed)
Immediate Anesthesia Transfer of Care Note  Patient: Kathleen Hart  Procedure(s) Performed: COLONOSCOPY WITH PROPOFOL  Patient Location: PACU  Anesthesia Type:General  Level of Consciousness: sedated  Airway & Oxygen Therapy: Patient Spontanous Breathing and Patient connected to nasal cannula oxygen  Post-op Assessment: Report given to RN and Post -op Vital signs reviewed and stable  Post vital signs: Reviewed and stable  Last Vitals:  Vitals Value Taken Time  BP    Temp    Pulse    Resp    SpO2      Last Pain:  Vitals:   10/23/21 0903  PainSc: 0-No pain         Complications: No notable events documented.

## 2021-10-24 LAB — SURGICAL PATHOLOGY

## 2021-10-25 ENCOUNTER — Encounter: Payer: Self-pay | Admitting: Gastroenterology

## 2021-10-31 ENCOUNTER — Encounter: Payer: Self-pay | Admitting: Cardiology

## 2021-10-31 ENCOUNTER — Ambulatory Visit: Payer: Medicare PPO | Admitting: Cardiology

## 2021-10-31 VITALS — BP 108/70 | HR 55 | Ht 68.5 in | Wt 209.1 lb

## 2021-10-31 DIAGNOSIS — R0609 Other forms of dyspnea: Secondary | ICD-10-CM | POA: Diagnosis not present

## 2021-10-31 DIAGNOSIS — I471 Supraventricular tachycardia: Secondary | ICD-10-CM | POA: Diagnosis not present

## 2021-10-31 DIAGNOSIS — I480 Paroxysmal atrial fibrillation: Secondary | ICD-10-CM

## 2021-10-31 DIAGNOSIS — R5382 Chronic fatigue, unspecified: Secondary | ICD-10-CM
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
## 2021-10-31 NOTE — Patient Instructions (Signed)
Medication Instructions:   Your physician recommends that you continue on your current medications as directed. Please refer to the Current Medication list given to you today.   *If you need a refill on your cardiac medications before your next appointment, please call your pharmacy*   Lab Work: None ordered  If you have labs (blood work) drawn today and your tests are completely normal, you will receive your results only by: [place] (if you have MyChart) OR A paper copy in the mail If you have any lab test that is abnormal or we need to change your treatment, we will call you to review the results.   Testing/Procedures: None ordered   Follow-Up: At [facility], you and your health needs are our priority.  As part of our continuing mission to provide you with exceptional heart care, we have created designated Provider Care Teams.  These Care Teams include your primary Cardiologist (physician) and Advanced Practice Providers (APPs -  Physician Assistants and Nurse Practitioners) who all work together to provide you with the care you need, when you need it.  We recommend signing up for the patient portal called "MyChart".  Sign up information is provided on this After Visit Summary.  MyChart is used to connect with patients for Virtual Visits (Telemedicine).  Patients are able to view lab/test results, encounter notes, upcoming appointments, etc.  Non-urgent messages can be sent to your provider as well.   To learn more about what you can do with MyChart, go to [email or web address].    Your next appointment:   6 month(s)  The format for your next appointment:   In Person  Provider:   You may see Glenetta Hew, MD or one of the following Advanced Practice Providers on your designated Care Team:   Murray Hodgkins, NP Christell Faith, PA-C Cadence Kathlen Mody, [place]   Other Instructions N/A  Important Information About Sugar

## 2021-11-17 ENCOUNTER — Encounter: Payer: Self-pay | Admitting: Cardiology
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
## 2021-11-20 ENCOUNTER — Encounter: Payer: Self-pay | Admitting: Cardiology

## 2021-11-20 NOTE — Assessment & Plan Note (Signed)
Much improved with weight loss and increased exercise.  Would probably try to avoid beta-blocker to avoid exacerbation of.

## 2021-11-20 NOTE — Assessment & Plan Note (Signed)
Probably more burden was bursts of PAT that are relatively short-lived longest was less than 30 seconds.  These very well could be progenitors of A-fib, but would not meet criteria for A-fib.  They are also pretty well controlled from a symptom standpoint on her current dose of diltiazem.  We did tolerate vagal maneuvers to potentially help after she has prolonged episodes although it is probably more likely if she would go into A-fib.   Plan: Continue diltiazem.

## 2021-11-20 NOTE — Assessment & Plan Note (Signed)
She seems to be pretty asymptomatic with relatively low burden of A-fib on her monitor.  Thankfully, her echocardiogram was relatively normal.  CPX was reassuring for lack of ischemia. . With the CHA2DS2-VASc score of 2-3 depending on whether or not there is atherosclerotic plaque (no imaging study to evaluate), she is on Eliquis for stroke prophylaxis.  Rate seems to be pretty well controlled on diltiazem.  At this point, no need for antiarrhythmic agent.

## 2021-11-20 NOTE — Assessment & Plan Note (Signed)
Clearly this is related to deconditioning and obesity because since she is lost 23 pounds over the last 6 months her symptoms have notably improved.  Her echocardiogram was relatively benign as well as her CPX.  She definitely understood the message from the CPX and is working toward adjusting her lifestyle still.  Hoping to lose a little bit more weight.

## 2022-02-12 NOTE — Progress Notes (Signed)
I,Sulibeya S Dimas,acting as a Education administrator for Lavon Paganini, MD.,have documented all relevant documentation on the behalf of Lavon Paganini, MD,as directed by  Lavon Paganini, MD while in the presence of Lavon Paganini, MD.    Established patient visit   Patient: Kathleen Hart   DOB: Dec 06, 1950   71 y.o. Female  MRN: [id number] Visit Date: 02/13/2022  Today's healthcare provider: Lavon Paganini, MD   Chief Complaint  Patient presents with   Hyperglycemia   Subjective    HPI  Prediabetes, Follow-up  Lab Results  Component Value Date   HGBA1C 5.4 08/12/2021   HGBA1C 5.8 (H) 04/24/2021   HGBA1C 5.5 01/28/2017   GLUCOSE 105 (H) 04/24/2021   GLUCOSE 106 (H) 04/22/2019   GLUCOSE 124 (H) 03/11/2018    Last seen for for this6 months ago.  Management since that visit includes no changes. Current symptoms include none and have been improving.  Prior visit with dietician: no Current diet: in general, a "healthy" diet  , low fat/ cholesterol Current exercise: walking  Pertinent Labs:    Component Value Date/Time   CHOL 176 04/24/2021 0956   TRIG 123 04/24/2021 0956   CHOLHDL 3.7 04/24/2021 0956   CHOLHDL 3 01/28/2017 0856   CREATININE 0.86 04/24/2021 0956    Wt Readings from Last 3 Encounters:  02/13/22 201 lb 14.4 oz (91.6 kg)  10/31/21 209 lb 2 oz (94.9 kg)  10/23/21 210 lb 6.9 oz (95.5 kg)    -----------------------------------------------------------------------------------------   Medications: Outpatient Medications Prior to Visit  Medication Sig   apixaban (ELIQUIS) 5 MG TABS tablet Take 1 tablet (5 mg total) by mouth 2 (two) times daily.   Ascorbic Acid (VITA-C PO) Take 1 tablet by mouth daily in the afternoon.   B Complex Vitamins (VITAMIN B COMPLEX PO) Take 1 tablet by mouth daily in the afternoon.   CALCIUM MAGNESIUM ZINC PO Take by mouth. Takes 1 tablet PO 3-4 days per week   Cholecalciferol (VITAMIN D3) 50 MCG (2000 UT) capsule Take  4,000 Units by mouth daily.   fluticasone (FLONASE) 50 MCG/ACT nasal spray Place 2 sprays into both nostrils daily as needed for allergies or rhinitis.   Multiple Vitamin (MULTIVITAMIN) tablet Take 1 tablet by mouth. 3-4 days per week   diltiazem (CARDIZEM CD) 180 MG 24 hr capsule Take 1 capsule (180 mg total) by mouth daily.   No facility-administered medications prior to visit.    Review of Systems per HPI     Objective    BP (!) 112/57 (BP Location: Left Arm, Patient Position: Sitting, Cuff Size: Large)   Pulse 66   Temp 97.7 F (36.5 C) (Oral)   Resp 16   Ht '5\' 8"'$  (1.727 m)   Wt 201 lb 14.4 oz (91.6 kg)   BMI 30.70 kg/m  BP Readings from Last 3 Encounters:  02/13/22 (!) 112/57  10/31/21 108/70  10/23/21 104/77   Wt Readings from Last 3 Encounters:  02/13/22 201 lb 14.4 oz (91.6 kg)  10/31/21 209 lb 2 oz (94.9 kg)  10/23/21 210 lb 6.9 oz (95.5 kg)      Physical Exam Vitals reviewed.  Constitutional:      General: She is not in acute distress.    Appearance: Normal appearance. She is well-developed. She is not diaphoretic.  HENT:     Head: Normocephalic and atraumatic.  Eyes:     General: No scleral icterus.    Conjunctiva/sclera: Conjunctivae normal.  Neck:  Thyroid: No thyromegaly.  Cardiovascular:     Rate and Rhythm: Normal rate and regular rhythm.     Pulses: Normal pulses.     Heart sounds: Normal heart sounds. No murmur heard. Pulmonary:     Effort: Pulmonary effort is normal. No respiratory distress.     Breath sounds: Normal breath sounds. No wheezing, rhonchi or rales.  Musculoskeletal:     Cervical back: Neck supple.     Right lower leg: No edema.     Left lower leg: No edema.  Lymphadenopathy:     Cervical: No cervical adenopathy.  Skin:    General: Skin is warm and dry.     Findings: No rash.  Neurological:     Mental Status: She is alert and oriented to person, place, and time. Mental status is at baseline.  Psychiatric:        Mood  and Affect: Mood normal.        Behavior: Behavior normal.       No results found for any visits on 02/13/22.  Assessment & Plan     Problem List Items Addressed This Visit       Cardiovascular and Mediastinum   PAF (paroxysmal atrial fibrillation) (HCC) (Chronic)    F/b Cardiology Recheck CBC On chronic anticoag      Relevant Orders   CBC     Other   Obesity    Congratulated on weight loss due to low calorie and intermittent fasting Discussed importance of healthy weight management Discussed diet and exercise       Relevant Orders   CBC   Lipid panel   Comprehensive metabolic panel   Prediabetes - Primary    Congratulated on improvement to normal A1c      Relevant Orders   POCT glycosylated hemoglobin (Hb A1C)     Return in about 6 months (around 08/15/2022) for CPE.      I, Lavon Paganini, MD, have reviewed all documentation for this visit. The documentation on 02/13/22 for the exam, diagnosis, procedures, and orders are all accurate and complete.   Neizan Debruhl, Dionne Bucy, MD, MPH [facility]

## 2022-02-13 ENCOUNTER — Ambulatory Visit: Payer: Medicare PPO | Admitting: Family Medicine

## 2022-02-13 ENCOUNTER — Encounter: Payer: Self-pay | Admitting: Family Medicine

## 2022-02-13 VITALS — BP 112/57 | HR 66 | Temp 97.7°F | Resp 16 | Ht 68.0 in | Wt 201.9 lb

## 2022-02-13 DIAGNOSIS — R7303 Prediabetes: Secondary | ICD-10-CM | POA: Diagnosis not present

## 2022-02-13 DIAGNOSIS — E669 Obesity, unspecified: Secondary | ICD-10-CM

## 2022-02-13 DIAGNOSIS — I480 Paroxysmal atrial fibrillation: Secondary | ICD-10-CM | POA: Diagnosis not present

## 2022-02-13 DIAGNOSIS — Z6830 Body mass index (BMI) 30.0-30.9, adult: Secondary | ICD-10-CM

## 2022-02-13 LAB — POCT GLYCOSYLATED HEMOGLOBIN (HGB A1C)
Est. average glucose Bld gHb Est-mCnc: 103
Hemoglobin A1C: 5.2 % (ref 4.0–5.6)

## 2022-02-13 NOTE — Assessment & Plan Note (Signed)
Congratulated on weight loss due to low calorie and intermittent fasting Discussed importance of healthy weight management Discussed diet and exercise

## 2022-02-13 NOTE — Assessment & Plan Note (Signed)
Congratulated on improvement to normal A1c

## 2022-02-13 NOTE — Assessment & Plan Note (Signed)
F/b Cardiology Recheck CBC On chronic anticoag

## 2022-02-14 LAB — CBC
Hematocrit: 43.7 % (ref 34.0–46.6)
Hemoglobin: 14.5 g/dL (ref 11.1–15.9)
MCH: 29.4 pg (ref 26.6–33.0)
MCHC: 33.2 g/dL (ref 31.5–35.7)
MCV: 89 fL (ref 79–97)
Platelets: 223 10*3/uL (ref 150–450)
RBC: 4.93 x10E6/uL (ref 3.77–5.28)
RDW: 13 % (ref 11.7–15.4)
WBC: 7.6 10*3/uL (ref 3.4–10.8)

## 2022-02-14 LAB — COMPREHENSIVE METABOLIC PANEL
ALT: 17 IU/L (ref 0–32)
AST: 18 IU/L (ref 0–40)
Albumin/Globulin Ratio: 1.9 (ref 1.2–2.2)
Albumin: 4.5 g/dL (ref 3.9–4.9)
Alkaline Phosphatase: 127 IU/L — ABNORMAL HIGH (ref 44–121)
BUN/Creatinine Ratio: 18 (ref 12–28)
BUN: 14 mg/dL (ref 8–27)
Bilirubin Total: 1.2 mg/dL (ref 0.0–1.2)
CO2: 21 mmol/L (ref 20–29)
Calcium: 10.2 mg/dL (ref 8.7–10.3)
Chloride: 103 mmol/L (ref 96–106)
Creatinine, Ser: 0.78 mg/dL (ref 0.57–1.00)
Globulin, Total: 2.4 g/dL (ref 1.5–4.5)
Glucose: 93 mg/dL (ref 70–99)
Potassium: 4.4 mmol/L (ref 3.5–5.2)
Sodium: 140 mmol/L (ref 134–144)
Total Protein: 6.9 g/dL (ref 6.0–8.5)
eGFR: 82 mL/min/{1.73_m2} (ref >59)

## 2022-02-14 LAB — LIPID PANEL
Chol/HDL Ratio: 2.8 ratio (ref 0.0–4.4)
Cholesterol, Total: 179 mg/dL (ref 100–199)
HDL: 64 mg/dL (ref >39)
LDL Chol Calc (NIH): 104 mg/dL — ABNORMAL HIGH (ref 0–99)
Triglycerides: 55 mg/dL (ref 0–149)
VLDL Cholesterol Cal: 11 mg/dL (ref 5–40)

## 2022-03-19 DIAGNOSIS — H353131 Nonexudative age-related macular degeneration, bilateral, early dry stage: Secondary | ICD-10-CM | POA: Diagnosis not present

## 2022-04-24 ENCOUNTER — Ambulatory Visit: Payer: Medicare PPO | Attending: Cardiology | Admitting: Cardiology

## 2022-04-24 ENCOUNTER — Encounter: Payer: Self-pay | Admitting: Cardiology

## 2022-04-24 VITALS — BP 106/82 | HR 62 | Ht 68.0 in | Wt 204.6 lb

## 2022-04-24 DIAGNOSIS — R0609 Other forms of dyspnea: Secondary | ICD-10-CM

## 2022-04-24 DIAGNOSIS — Z6830 Body mass index (BMI) 30.0-30.9, adult: Secondary | ICD-10-CM | POA: Diagnosis not present

## 2022-04-24 DIAGNOSIS — I480 Paroxysmal atrial fibrillation: Secondary | ICD-10-CM

## 2022-04-24 DIAGNOSIS — D6869 Other thrombophilia: Secondary | ICD-10-CM | POA: Diagnosis not present

## 2022-04-24 DIAGNOSIS — E669 Obesity, unspecified: Secondary | ICD-10-CM | POA: Diagnosis not present

## 2022-04-24 NOTE — Patient Instructions (Signed)
Medication Instructions:  No changes at this time.   *If you need a refill on your cardiac medications before your next appointment, please call your pharmacy*   Lab Work: None  If you have labs (blood work) drawn today and your tests are completely normal, you will receive your results only by: [place] (if you have MyChart) OR A paper copy in the mail If you have any lab test that is abnormal or we need to change your treatment, we will call you to review the results.   Testing/Procedures: None   Follow-Up: At [facility], you and your health needs are our priority.  As part of our continuing mission to provide you with exceptional heart care, we have created designated Provider Care Teams.  These Care Teams include your primary Cardiologist (physician) and Advanced Practice Providers (APPs -  Physician Assistants and Nurse Practitioners) who all work together to provide you with the care you need, when you need it.   Your next appointment:   Follow up in 6 months with APP Follow up with Dr. Ellyn Hack in 1 year  The format for your next appointment:   In [place]
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
## 2022-04-29 ENCOUNTER — Encounter: Payer: Self-pay | Admitting: Cardiology

## 2022-04-29 DIAGNOSIS — I480 Paroxysmal atrial fibrillation: Secondary | ICD-10-CM | POA: Insufficient documentation

## 2022-04-29 NOTE — Assessment & Plan Note (Addendum)
This patients CHA2DS2-VASc Score and unadjusted Ischemic Stroke Rate (% per year) is equal to 2.2 % stroke rate/year from a score of 2 => we talked about pros and cons of anticoagulation versus not.  Until we are sure of how much her A-fib burden will be, I think we should continue with DOAC.  Above score calculated as 1 point each if present [CHF, HTN, DM, Vascular=MI/PAD/Aortic Plaque, Age if 65-74, or Female] Above score calculated as 2 points each if present [Age > 75, or Stroke/TIA/TE]  Okay to hold Eliquis 24 to 48 hours preop for surgeries or procedures.  (72 hours for neuro procedures.)

## 2022-04-29 NOTE — Assessment & Plan Note (Signed)
The patient understands the need to lose weight with diet and exercise. We have discussed specific strategies for this.  Has been doing well with her weight loss, but now plateaued out.  Continue to encourage exercise and dietary modification.

## 2022-04-29 NOTE — Assessment & Plan Note (Signed)
This seems to be doing much better with her weight loss.  She still has some exertional dyspnea but notably improved with change in weight and increasing exercise level.  Relatively normal findings on CPX and Echo.

## 2022-04-29 NOTE — Assessment & Plan Note (Signed)
No suggestion of ischemia on CPX.  Echo checked, relatively benign findings.  Continue diltiazem.

## 2022-04-29 NOTE — Assessment & Plan Note (Addendum)
Remains on diltiazem-tolerated over beta-blocker for rate control. On Eliquis as well with no bleeding issues.  At this point she is not familiar now with what her symptoms were.  Mostly noted fatigue and dyspnea exercise intolerance.  Has been on DOAC long enough to consider cardioversion where she did present again with A-fib. At present, no need for antiarrhythmic agent.  Need to discuss potential OSA evaluation and follow-up.

## 2022-08-07 ENCOUNTER — Other Ambulatory Visit: Payer: Self-pay | Admitting: Cardiology

## 2022-08-07 DIAGNOSIS — I480 Paroxysmal atrial fibrillation: Secondary | ICD-10-CM

## 2022-08-08 NOTE — Telephone Encounter (Signed)
Prescription refill request for Eliquis received. Indication: a fib Last office visit: 04/24/22 Scr: 0.78 02/13/22 epic Age: 72 Weight: 92kg

## 2022-08-18 ENCOUNTER — Encounter: Payer: Medicare PPO | Admitting: Family Medicine

## 2022-09-30 ENCOUNTER — Encounter: Payer: Medicare PPO | Admitting: Family Medicine

## 2022-11-03 ENCOUNTER — Encounter: Payer: Medicare PPO | Admitting: Family Medicine

## 2023-02-03 ENCOUNTER — Encounter: Payer: Medicare PPO | Admitting: Family Medicine

## 2023-02-04 ENCOUNTER — Ambulatory Visit: Payer: Medicare PPO

## 2023-02-04 VITALS — Ht 68.0 in | Wt 204.0 lb

## 2023-02-04 DIAGNOSIS — Z0000 Encounter for general adult medical examination without abnormal findings: Secondary | ICD-10-CM | POA: Diagnosis not present
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
## 2023-02-04 NOTE — Patient Instructions (Signed)
Ms. Mikula , Thank you for taking time to come for your Medicare Wellness Visit. I appreciate your ongoing commitment to your health goals. Please review the following plan we discussed and let me know if I can assist you in the future.   Referrals/Orders/Follow-Ups/Clinician Recommendations: none  This is a list of the screening recommended for you and due dates:  Health Maintenance  Topic Date Due   Zoster (Shingles) Vaccine (1 of 2) Never done   Mammogram  05/22/2022   Flu Shot  Never done   COVID-19 Vaccine (1 - 2023-24 season) Never done   DEXA scan (bone density measurement)  10/16/2023   Medicare Annual Wellness Visit  02/04/2024   Colon Cancer Screening  10/23/2024   DTaP/Tdap/Td vaccine (2 - Td or Tdap) 05/01/2025   Pneumonia Vaccine  Completed   Hepatitis C Screening  Completed   HPV Vaccine  Aged Out    Advanced directives: (Copy Requested) Please bring a copy of your health care power of attorney and living will to the office to be added to your chart at your convenience.  Next Medicare Annual Wellness Visit scheduled for next year: Yes 02/08/24 @ 10:05 telephone

## 2023-05-19 DIAGNOSIS — H353131 Nonexudative age-related macular degeneration, bilateral, early dry stage: Secondary | ICD-10-CM | POA: Diagnosis not present

## 2023-10-09 ENCOUNTER — Encounter: Payer: Self-pay | Admitting: Cardiology
